# Patient Record
Sex: Male | Born: 2000 | Race: White | Hispanic: No | Marital: Single | Smoking: Never smoker
Health system: Southern US, Community
[De-identification: ages and names within clinical notes are randomized; demographics above are authoritative.]

## PROBLEM LIST (undated history)

## (undated) DIAGNOSIS — L409 Psoriasis, unspecified: Secondary | ICD-10-CM

## (undated) DIAGNOSIS — K51018 Ulcerative (chronic) pancolitis with other complication: Secondary | ICD-10-CM

## (undated) DIAGNOSIS — K626 Ulcer of anus and rectum: Secondary | ICD-10-CM

## (undated) DIAGNOSIS — R6252 Short stature (child): Secondary | ICD-10-CM

## (undated) DIAGNOSIS — K5090 Crohn's disease, unspecified, without complications: Secondary | ICD-10-CM

## (undated) DIAGNOSIS — K529 Noninfective gastroenteritis and colitis, unspecified: Secondary | ICD-10-CM

## (undated) DIAGNOSIS — F32A Depression, unspecified: Secondary | ICD-10-CM

## (undated) DIAGNOSIS — K5909 Other constipation: Secondary | ICD-10-CM

## (undated) DIAGNOSIS — E230 Hypopituitarism: Secondary | ICD-10-CM

## (undated) DIAGNOSIS — R159 Full incontinence of feces: Secondary | ICD-10-CM

## (undated) DIAGNOSIS — K602 Anal fissure, unspecified: Secondary | ICD-10-CM

## (undated) DIAGNOSIS — F909 Attention-deficit hyperactivity disorder, unspecified type: Secondary | ICD-10-CM

## (undated) DIAGNOSIS — K649 Unspecified hemorrhoids: Secondary | ICD-10-CM

## (undated) DIAGNOSIS — M419 Scoliosis, unspecified: Secondary | ICD-10-CM

## (undated) DIAGNOSIS — E739 Lactose intolerance, unspecified: Secondary | ICD-10-CM

## (undated) DIAGNOSIS — F419 Anxiety disorder, unspecified: Secondary | ICD-10-CM

## (undated) DIAGNOSIS — K9041 Non-celiac gluten sensitivity: Secondary | ICD-10-CM

## (undated) DIAGNOSIS — D649 Anemia, unspecified: Secondary | ICD-10-CM

## (undated) DIAGNOSIS — F39 Unspecified mood [affective] disorder: Secondary | ICD-10-CM

## (undated) DIAGNOSIS — K603 Anal fistula, unspecified: Secondary | ICD-10-CM

## (undated) HISTORY — DX: Other constipation: K59.09

## (undated) HISTORY — DX: Psoriasis, unspecified: L40.9

## (undated) HISTORY — DX: Anal fistula, unspecified: K60.30

## (undated) HISTORY — PX: ADENOIDECTOMY: SUR15

## (undated) HISTORY — DX: Ulcerative (chronic) pancolitis with other complication: K51.018

## (undated) HISTORY — DX: Crohn's disease, unspecified, without complications: K50.90

## (undated) HISTORY — DX: Full incontinence of feces: R15.9

## (undated) HISTORY — DX: Hypopituitarism: E23.0

## (undated) HISTORY — DX: Anal fissure, unspecified: K60.2

## (undated) HISTORY — DX: Unspecified hemorrhoids: K64.9

## (undated) HISTORY — DX: Short stature (child): R62.52

## (undated) HISTORY — DX: Unspecified mood (affective) disorder: F39

## (undated) HISTORY — DX: Anemia, unspecified: D64.9

## (undated) HISTORY — DX: Depression, unspecified: F32.A

## (undated) HISTORY — PX: OTHER SURGICAL HISTORY: SHX169

## (undated) HISTORY — DX: Ulcer of anus and rectum: K62.6

## (undated) HISTORY — DX: Attention-deficit hyperactivity disorder, unspecified type: F90.9

---
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
## 2019-01-01 ENCOUNTER — Ambulatory Visit (INDEPENDENT_AMBULATORY_CARE_PROVIDER_SITE_OTHER): Payer: Managed Care, Other (non HMO) | Admitting: Licensed Clinical Social Worker

## 2019-01-01 ENCOUNTER — Other Ambulatory Visit: Payer: Self-pay

## 2019-01-01 DIAGNOSIS — F4323 Adjustment disorder with mixed anxiety and depressed mood: Secondary | ICD-10-CM | POA: Diagnosis not present

## 2019-01-01 NOTE — BH Specialist Note (Signed)
Integrated Behavioral Health Follow Up Visit via Telemedicine Video Visit  07/10/2018 MRN: [id number] Name: Bradley [name]  Number of [place] Clinician visits:: 16 (CCA completed) Session Start time: 1:58 PM Session End time:  3:04 PM Total time: 1 hour 6 minutes  Type of Service: [place] Interpretor:No. Interpretor Name and Language: N/A  Referring Provider: Dr. Yehuda Savannah Type of Visit: Video Patient/Family location: grandparent's house [facility] Provider location: office All persons participating in visit: Lynda, [place]. Hermann Dottavio LCSW  Discussed confidentiality: Yes   I connected with [name] by a video enabled telemedicine application and verified that I am speaking with the correct person(s).  I discussed the limitations of evaluation and management by telemedicine and the availability of in person appointments.  I discussed that the purpose of this visit is to provide behavioral health care while limiting exposure to the novel coronavirus.  Discussed there is a possibility of technology failure and discussed alternative modes of communication if that failure occurs. I discussed that engaging in this video visit, they consent to the provision of behavioral healthcare and the services will be billed under their insurance.  Patient expressed understanding and consented to video visit: Yes    PRESENTING CONCERNS: Patient and/or family reports the following symptoms/concerns: Had a small panic attack last week when at dad's house for lunch. He was able to have some good interactions with paternal grandparents and leave on a positive note. He is focusing on new jobs & interviews, reconnecting with friends, meeting new people and playing a new Public house manager. Duration of problem: 4+ months; Severity of problem: moderate  LIFE CONTEXT:  Below is still current Family and Social: lives with mom, stepdad ("bonus" dad), brother. Was living  with dad and paternal grandparents for 4 months. Close with maternal grandparents and maternal aunt & her family.  School/Work: Taking a gap year 2020-2021. 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 Reduce symptoms of: anxiety, depression and stress 2. Increase knowledge and/or ability of: coping skills and stress reduction  3. Demonstrate ability to: Increase healthy adjustment to current life circumstances  INTERVENTIONS: Interventions utilized: Brief CBT and Supportive Counseling  Standardized Assessments completed: Not Needed  ASSESSMENT: Patient currently experiencing improvement in stress overall. He had an increase in anxiety last week when at dad's and feeling disturbed by some of the disorganization in the home and then felt pressure to clean up his Lego quickly so it is unorganized. [facility] used CBT to help Markevius challenge some of his thinking about being "ashamed" by his things being unorganized as well as what dad should do to show he cares. Overall, Rosbel feels he ended on a good note there and is willing to reach out every 1-2 weeks via phone.  Tabitha is exploring other interests as well, such as reconnecting with friends, prepping for speech & debate, and playing a new game online. He has connected with a new friend through that and had questions about attachments and how to avoid obsessing. Discussed different types of attachment, setting expectations and boundaries, and using safety rules for people met online.   Patient may benefit from ongoing therapy to help manage concerns about life changes due to Covid restrictions and family dynamics.  PLAN: 1. Follow up with behavioral health clinician on : 10/13 2. Behavioral recommendations: If you go back to  dad's, make a plan of what you can say to yourself if things look  unorganized. Have coping skills ready. Otherwise, keep working on identifying what contact feels healthy for you right now. Continue exploring your other interests. With new friendships, remind yourself of the limitations (w/ person who lives in [place]), set boundaries on when to contact, and keep exploring your other interests and friendships. 3. Referral(s): [place] (In Clinic)  I discussed the assessment and treatment plan with the patient. They were provided an opportunity to ask questions and all were answered. They agreed with the plan and demonstrated an understanding of the instructions.   They were advised to call back or seek an in-person evaluation if the symptoms worsen or if the condition fails to improve as anticipated.  Cristi Gwynn E, LCSW

## 2019-01-04 ENCOUNTER — Encounter (INDEPENDENT_AMBULATORY_CARE_PROVIDER_SITE_OTHER): Payer: Self-pay

## 2019-01-08 ENCOUNTER — Other Ambulatory Visit: Payer: Self-pay

## 2019-01-08 ENCOUNTER — Ambulatory Visit (INDEPENDENT_AMBULATORY_CARE_PROVIDER_SITE_OTHER): Payer: Managed Care, Other (non HMO) | Admitting: Licensed Clinical Social Worker

## 2019-01-08 DIAGNOSIS — F4323 Adjustment disorder with mixed anxiety and depressed mood: Secondary | ICD-10-CM | POA: Diagnosis not present

## 2019-01-08 NOTE — BH Specialist Note (Signed)
Integrated Behavioral Health Follow Up Visit via Telemedicine Video Visit  07/10/2018 MRN: [id number] Name: Bradley [name]  Number of [place] Clinician visits:: 17 (CCA completed) Session Start time: 2:59 PM Session End time:  3:29 PM Total time: 30 minutes  Type of Service: [place] Interpretor:No. Interpretor Name and Language: N/A  Referring Provider: Dr. Yehuda Savannah Type of Visit: Video Patient/Family location: home [facility] Provider location: office All persons participating in visit: Viraaj, Vorndran. Faelynn Wynder LCSW  Discussed confidentiality: Yes   I connected with [name] by a video enabled telemedicine application and verified that I am speaking with the correct person(s).  I discussed the limitations of evaluation and management by telemedicine and the availability of in person appointments.  I discussed that the purpose of this visit is to provide behavioral health care while limiting exposure to the novel coronavirus.  Discussed there is a possibility of technology failure and discussed alternative modes of communication if that failure occurs. I discussed that engaging in this video visit, they consent to the provision of behavioral healthcare and the services will be billed under their insurance.  Patient expressed understanding and consented to video visit: Yes    PRESENTING CONCERNS: Patient and/or family reports the following symptoms/concerns: stress regarding navigating new online connection.  Duration of problem: 4+ months; Severity of problem: moderate  LIFE CONTEXT:  Below is still current Family and Social: lives with mom, stepdad ("bonus" dad), brother. Was living with dad and paternal grandparents for 4 months. Close with maternal grandparents and maternal aunt & her family.  School/Work: Taking a gap year 2020-2021. 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 Reduce symptoms of: anxiety, depression and stress 2. Increase knowledge and/or ability of: coping skills and stress reduction  3. Demonstrate ability to: Increase healthy adjustment to current life circumstances  INTERVENTIONS: Interventions utilized: Brief CBT and Supportive Counseling  Standardized Assessments completed: Not Needed  ASSESSMENT: Patient currently experiencing some stress with navigating the online connection with the girl from [place]. He was in a panic earlier in the week about it, but has been able to successfully process through what happened. Jabreel does feel he "can't trust himself" because he developed a small crush (which he no longer feels) even though he knows nothing would come of it because of the distance. However, he has started to notice more of what he wants from future relationships and learning more about what he values.   Patient may benefit from ongoing therapy to help manage concerns about life changes and family dynamics.  PLAN: 1. Follow up with behavioral health clinician on : 10/13 2. Behavioral recommendations: continuing noticing your reactions to information and how people act towards you. This will help you recognize your values and what you do or do not want in future platonic and romantic relationships  3. Referral(s): [place] (In Clinic)  I discussed the assessment and treatment plan with the patient. They were provided an opportunity to ask questions and all were answered. They agreed with the plan and demonstrated an understanding of the instructions.   They were advised to call back or seek an in-person evaluation if the symptoms worsen or if the condition fails to improve as anticipated.  Tonji Elliff E, LCSW

## 2019-01-08 NOTE — Progress Notes (Signed)
Pediatric Gastroenterology Return Visit   REFERRING PROVIDER:  [name], MD [place],  [place] [id number]   ASSESSMENT:     I had the pleasure of seeing Bradley [name], 18 y.o. male (DOB: 2000-12-27) who I saw in follow up today for evaluation of solitary rectal ulcer syndrome, skin tags, constipation, recurrent rectal prolapse,  s/p C difficile infection, in the context of growth hormone deficiency, off growth hormone therapy. Overall, he is doing better. He is asking for a more permanent solution to his issues with defecation mechanics. I offered to request anorectal manometry (ARM), so that we gain insight into his muscular activity during defecation and can advise him accordingly. I explained ARM to him, and he agreed to move forward. I will ask him to under pelvic floor physical therapy guided by the ARM result.  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 This worked for him. He also has needed a round of 40 mg of prednisone since his last visit for 5 days.  He is on perianal Protopic, which he uses occasionally. He has developed intermittent pruritus in his anal skin, but has no lesions on exam. I advised to use a skin moisturizer (e.g. Aveeno) to his anal skin.  I encouraged him to continue communicating via MyChart.      PLAN:       Protopic BID as needed Skin moisturizer for anal skin  Anorectal manometry Albuterol to treat proctalgia fugax Made myself available via MyChart Touch base in 4 weeks   Thank you for allowing [place] to participate in the care of your patient     HISTORY OF PRESENT ILLNESS: Bradley [name] is a 18 y.o. male (DOB: Aug 10, 2000) who is seen in follow up for evaluation of solitary rectal ulcer syndrome with perianal inflammation. History was obtained from Bradley [name].  Bradley [name] found benefit from topical tacrolimus, with reduction of his perianal tags. He uses it occasionally now. He manages proctalgia with albuterol. He  has managed to wean his prednisone, but he took a 5-day course since his last visit. He has intermittent perianal pruritus.  He is staying at his mom's house. He is working at [employer] part-time and doing school work.   PAST MEDICAL HISTORY: Past Medical History:  Diagnosis Date  . ADHD (attention deficit hyperactivity disorder)   . Anxiety   . Chronic constipation   . 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 GSE (gluten-sensitive enteropathy)   . Inflammatory bowel disease   . Lactose intolerance   . Mood disorder ([place])   . Scoliosis   . Short stature    Immunization History  Administered Date(s) Administered  . HPV 9-valent 01/08/2019  . Influenza, Quadrivalent, Recombinant, Inj, Pf 01/08/2019   PAST SURGICAL HISTORY: Past Surgical History:  Procedure Laterality Date  . ADENOIDECTOMY    . COLONOSCOPY N/A 12/27/2015   Procedure: COLONOSCOPY;  Surgeon: Joycelyn Rua, MD;  Location: [place];  Service: Gastroenterology;  Laterality: N/A;  . ESOPHAGOGASTRODUODENOSCOPY N/A 12/27/2015   Procedure: ESOPHAGOGASTRODUODENOSCOPY (EGD);  Surgeon: Joycelyn Rua, MD;  Location: [place];  Service: Gastroenterology;  Laterality: N/A;  . ESOPHAGOGASTRODUODENOSCOPY N/A 08/03/2016   Procedure: ESOPHAGOGASTRODUODENOSCOPY (EGD);  Surgeon: Joycelyn Rua, MD;  Location: [place];  Service: Gastroenterology;  Laterality: N/A;  . FLEXIBLE SIGMOIDOSCOPY  08/03/2016   Procedure: FLEXIBLE SIGMOIDOSCOPY;  Surgeon: Joycelyn Rua, MD;  Location: [facility] ENDOSCOPY;  Service: Gastroenterology;;  . tubes and adenoids     SOCIAL HISTORY: Social History   Socioeconomic  History  . Marital status: Single    Spouse name: Not on file  . Number of children: Not on file  . Years of education: Not on file  . Highest education level: Not on file  Occupational History  . Not on file  Social Needs  . Financial resource strain: Not on file  . Food insecurity    Worry: Not on file    Inability: Not on file  .  Transportation needs    Medical: Not on file    Non-medical: Not on file  Tobacco Use  . Smoking status: Never Smoker  . Smokeless tobacco: Never Used  Substance and Sexual Activity  . Alcohol use: No  . Drug use: No  . Sexual activity: Not Currently    Birth control/protection: None  Lifestyle  . Physical activity    Days per week: Not on file    Minutes per session: Not on file  . Stress: Not on file  Relationships  . Social Herbalist on phone: Not on file    Gets together: Not on file    Attends religious service: Not on file    Active member of club or organization: Not on file    Attends meetings of clubs or organizations: Not on file    Relationship status: Not on file  Other Topics Concern  . Not on file  Social History Narrative   Lives at home with mom dad and brother. 12 th grade    FAMILY HISTORY: family history includes Cancer in his maternal grandfather, paternal grandfather, and paternal grandmother; Depression in his maternal aunt, maternal grandmother, and mother; Diabetes in his paternal grandfather; Hyperlipidemia in his maternal grandfather, maternal grandmother, paternal grandfather, and paternal grandmother; Hypertension in his maternal grandfather; Miscarriages / [place] in his mother; Thyroid disease in his paternal grandfather and paternal grandmother.   REVIEW OF SYSTEMS:  The balance of 12 systems reviewed is negative except as noted in the HPI.  MEDICATIONS: Current Outpatient Medications  Medication Sig Dispense Refill  . escitalopram (LEXAPRO) 20 MG tablet Take 20 mg by mouth daily.    Marland Kitchen albuterol (VENTOLIN HFA) 108 (90 Base) MCG/ACT inhaler Inhale 2 puffs into the lungs every 6 (six) hours as needed for wheezing or shortness of breath. 1 Inhaler 2  . Iron-Vitamin C (IRON 100/C) 100-250 MG TABS Take by mouth.    . lamoTRIgine (LAMICTAL) 150 MG tablet Take 1 tablet (150 mg total) by mouth 2 (two) times daily. 60 tablet 1  .  methylphenidate 27 MG PO TB24     . Tacrolimus (PROTOPIC EX) Apply topically 2 (two) times a day.    . traZODone (DESYREL) 50 MG tablet      No current facility-administered medications for this visit.    ALLERGIES: Trileptal [oxcarbazepine]  VITAL SIGNS: BP 100/64   Pulse (!) 114   Wt 106 lb 9.6 oz (48.4 kg)   BMI 18.88 kg/m  PHYSICAL EXAM: Constitutional: Alert, no acute distress, well nourished, and well hydrated.  Mental Status: Pleasantly interactive, not anxious appearing. HEENT: PERRL, conjunctiva clear, anicteric, oropharynx clear, neck supple, no LAD. Respiratory: Clear to auscultation, unlabored breathing. Cardiac: Euvolemic, regular rate and rhythm, normal S1 and S2, no murmur. Abdomen: Soft, normal bowel sounds, non-distended, non-tender, no organomegaly or masses. Perianal/Rectal Exam: Normal position of the anus, no spine dimples, no hair tufts. No skin tags or hemorrhoids or fissures. Extremities: No edema, well perfused. Musculoskeletal: No joint swelling or tenderness noted, no  deformities. Skin: No rashes, jaundice or skin lesions noted. Neuro: No focal deficits.    No results found for this or any previous visit (from the past 2160 hour(s)).   Merica Prell A. Yehuda Savannah, MD Chief, Division of Pediatric Gastroenterology Professor of Pediatrics

## 2019-01-12 ENCOUNTER — Other Ambulatory Visit: Payer: Self-pay

## 2019-01-12 ENCOUNTER — Encounter (INDEPENDENT_AMBULATORY_CARE_PROVIDER_SITE_OTHER): Payer: Self-pay | Admitting: Pediatric Gastroenterology

## 2019-01-12 ENCOUNTER — Ambulatory Visit (INDEPENDENT_AMBULATORY_CARE_PROVIDER_SITE_OTHER): Payer: Managed Care, Other (non HMO) | Admitting: Pediatric Gastroenterology

## 2019-01-12 VITALS — BP 100/64 | HR 114 | Wt 106.6 lb

## 2019-01-12 DIAGNOSIS — K626 Ulcer of anus and rectum: Secondary | ICD-10-CM | POA: Diagnosis not present

## 2019-01-12 NOTE — Patient Instructions (Signed)
Contact information For emergencies after hours, on holidays or weekends: call [phone] and ask for the pediatric gastroenterologist on call.  For regular business hours: Pediatric GI phone number: Eustace Moore [phone] OR Use MyChart to send messages  A special favor Our waiting list is over 2 months. Other children are waiting to be seen in our clinic. If you cannot make your next appointment, please contact us with at least 2 days notice to cancel and reschedule. Your timely phone call will allow another child to use the clinic slot.  Thank you!

## 2019-01-13 ENCOUNTER — Other Ambulatory Visit: Payer: Self-pay

## 2019-01-13 ENCOUNTER — Encounter (INDEPENDENT_AMBULATORY_CARE_PROVIDER_SITE_OTHER): Payer: Self-pay | Admitting: *Deleted

## 2019-01-13 ENCOUNTER — Ambulatory Visit (INDEPENDENT_AMBULATORY_CARE_PROVIDER_SITE_OTHER): Payer: Managed Care, Other (non HMO) | Admitting: Licensed Clinical Social Worker

## 2019-01-13 DIAGNOSIS — F4323 Adjustment disorder with mixed anxiety and depressed mood: Secondary | ICD-10-CM | POA: Diagnosis not present

## 2019-01-13 NOTE — BH Specialist Note (Signed)
Integrated Behavioral Health Follow Up Visit via Telemedicine Video Visit  07/10/2018 MRN: [id number] Name: Bradley [name]  Number of [place] Clinician visits:: 18 (CCA completed) Session Start time: 8:27 AM    Session End time:  9:20 AM Total time: 53 minutes  Type of Service: [place] Interpretor:No. Interpretor Name and Language: N/A  Referring Provider: Dr. Yehuda Savannah Type of Visit: Video Patient/Family location: home [facility] Provider location: office All persons participating in visit: Willys, Salvino. Stoisits LCSW  Discussed confidentiality: Yes   I connected with [name] by a video enabled telemedicine application and verified that I am speaking with the correct person(s).  I discussed the limitations of evaluation and management by telemedicine and the availability of in person appointments.  I discussed that the purpose of this visit is to provide behavioral health care while limiting exposure to the novel coronavirus.  Discussed there is a possibility of technology failure and discussed alternative modes of communication if that failure occurs. I discussed that engaging in this video visit, they consent to the provision of behavioral healthcare and the services will be billed under their insurance.  Patient expressed understanding and consented to video visit: Yes    PRESENTING CONCERNS: Patient and/or family reports the following symptoms/concerns: continuing working on setting boundaries and managing stressors with dad, paternal grandparents, and new people he is meeting online. 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   Duration of problem: 4+ months; Severity of problem: moderate  LIFE CONTEXT:  Below is still current Family and Social: lives with mom, stepdad ("bonus" dad), brother. Was living with dad and paternal grandparents  for 4 months. Close with maternal grandparents and maternal aunt & her family.  School/Work: Taking a gap year 2020-2021. 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 Applied to [employer] for next year Self-Care: likes video games, Nature conservation officer, talking to people, cooking  GOALS ADDRESSED: Below is still current Patient will: 1. Reduce symptoms of: anxiety, depression and stress 2. Increase knowledge and/or ability of: coping skills and stress reduction  3. Demonstrate ability to: Increase healthy adjustment to current life circumstances  INTERVENTIONS: Interventions utilized: Brief CBT and Supportive Counseling  Standardized Assessments completed: Not Needed  ASSESSMENT: Patient currently doing well with mood since last week. He was able to set a healthy limit with person from [place] online and is setting limits while still learning about another person he met online. He was bothered by how he developed a crush on the first girl without intending to because he prides himself on being logical. Discussed how emotions aren't logical but they are normal and explored how he still used logic and reasoning to notice what he did or did not appreciate about the interactions and how to use what he learned when deciding who to pursue relationships with in the future.  He went to his dad's house and helped set up and host a zoom birthday party for his PGM and was able to do so successfully without having an anxiety attack during this visit by utilizing his other support people prior to going over.   Patient may benefit from ongoing therapy to help manage concerns about life changes and family dynamics.  PLAN: 1. Follow up with behavioral health clinician on : 1 week 2. Behavioral recommendations: continuing noticing your reactions to information and how people  act towards you. This will help you recognize your values and what you do or do not want in future platonic and  romantic relationships. Good job working on setting boundaries  3. Referral(s): [place] (In Clinic)  I discussed the assessment and treatment plan with the patient. They were provided an opportunity to ask questions and all were answered. They agreed with the plan and demonstrated an understanding of the instructions.   They were advised to call back or seek an in-person evaluation if the symptoms worsen or if the condition fails to improve as anticipated.  STOISITS, MICHELLE E, LCSW

## 2019-01-20 ENCOUNTER — Telehealth (INDEPENDENT_AMBULATORY_CARE_PROVIDER_SITE_OTHER): Payer: Self-pay | Admitting: Pediatric Gastroenterology

## 2019-01-20 ENCOUNTER — Ambulatory Visit (INDEPENDENT_AMBULATORY_CARE_PROVIDER_SITE_OTHER): Payer: Managed Care, Other (non HMO) | Admitting: Licensed Clinical Social Worker

## 2019-01-20 ENCOUNTER — Other Ambulatory Visit: Payer: Self-pay

## 2019-01-20 DIAGNOSIS — F4323 Adjustment disorder with mixed anxiety and depressed mood: Secondary | ICD-10-CM

## 2019-01-20 NOTE — Telephone Encounter (Signed)
  Who's calling (name and relationship to patient) : Bradley [name], patient  Best contact number: [phone]  Provider they see: Dr. Yehuda Savannah   Reason for call: Patient states per a conversation with Dr. Yehuda Savannah there is procedure that was supposed to be scheduled called an ano rectal manometry. Patient hasn't heard back yet, spoke with Eustace Moore who states she will get in touch with Ria Comment from [facility] to schedule that procedure at [facility]. Patient also has questions relating to finances, he plans to call his insurance first to see if they cover this and how much, but will call us back with further questions. Please call with an update relating to scheduling of the procedure when there is one.     PRESCRIPTION REFILL ONLY  Name of prescription:  Pharmacy:

## 2019-01-20 NOTE — Telephone Encounter (Signed)
For the ARM procedures, those are faxed to adult GI in [place]. I have faxed over the referral and demographics with the insurance card to them. They should be  Calling the patient to set up an appointment to get that taken care of.

## 2019-01-20 NOTE — BH Specialist Note (Signed)
Integrated Behavioral Health Follow Up Visit via Telemedicine Video Visit  07/10/2018 MRN: [id number] Name: Bradley [name]  Number of [place] Clinician visits:: 88 (CCA completed) Session Start time: 8:24 AM    Session End time:  9:24 AM Total time: 1 hour  Type of Service: [place] Interpretor:No. Interpretor Name and Language: N/A  Referring Provider: Dr. Yehuda Savannah Type of Visit: Video Patient/Family location: home [facility] Provider location: office All persons participating in visit: [place], Prieur. Lupie Sawa LCSW  Discussed confidentiality: Yes   I connected with [name] by a video enabled telemedicine application and verified that I am speaking with the correct person(s).  I discussed the limitations of evaluation and management by telemedicine and the availability of in person appointments.  I discussed that the purpose of this visit is to provide behavioral health care while limiting exposure to the novel coronavirus.  Discussed there is a possibility of technology failure and discussed alternative modes of communication if that failure occurs. I discussed that engaging in this video visit, they consent to the provision of behavioral healthcare and the services will be billed under their insurance.  Patient expressed understanding and consented to video visit: Yes    PRESENTING CONCERNS: Patient and/or family reports the following symptoms/concerns: improvements in mood. He is walking with MGM and her friends more often now (5 miles), received a job offer from [employer], and was successful in hosting a zoom birthday for [facility]. He is able to better handle comments from dad that previously were hurtful or anxiety-producing for him.   Duration of problem: 4+ months; Severity of problem: moderate  LIFE CONTEXT:  Below is still current Family and Social: lives with mom, stepdad ("bonus" dad), brother. Was living with dad and  paternal grandparents for 4 months. Close with maternal grandparents and maternal aunt & her family.  School/Work: Taking a gap year 2020-2021. Working at [employer], working at [employer], interviewing for other jobs. Applied to [employer] for next year Self-Care: likes video games, Nature conservation officer, talking to people, cooking  GOALS ADDRESSED: Below is still current Patient will: 1. Reduce symptoms of: anxiety, depression and stress 2. Increase knowledge and/or ability of: coping skills and stress reduction  3. Demonstrate ability to: Increase healthy adjustment to current life circumstances  INTERVENTIONS: Interventions utilized: Brief CBT and Supportive Counseling  Standardized Assessments completed: Not Needed  ASSESSMENT: Patient currently doing well overall. He has been able to spend more time talking with support people, including on walks with his MGM. He is continuing to work on seeing situations in more than black and white and changing how he reacts to dad by changing his thinking (working on noticing how to set reasonable expectations and not meeting standards that are unrealistic). He is moving forward with procedure and PT to help him physically as well.  Patient may benefit from continuing use of skills and supports to help manage concerns about life changes and family dynamics.  PLAN: 1. Follow up with behavioral health clinician on : PRN 2. Behavioral recommendations: keep trying to notice other perspectives (more than black and white). Continue spending time with your support people (brother, MGM, mom) 3. Referral(s): [place] (In Clinic)  I discussed the assessment and treatment plan with the patient. They were provided an opportunity to ask questions and all were answered. They agreed with the plan and demonstrated an understanding of the instructions.   They were advised to call back or  seek an in-person evaluation if the  symptoms worsen or if the condition fails to improve as anticipated.  Merian Wroe E, LCSW

## 2019-01-22 ENCOUNTER — Encounter (INDEPENDENT_AMBULATORY_CARE_PROVIDER_SITE_OTHER): Payer: Self-pay

## 2019-01-22 ENCOUNTER — Telehealth (INDEPENDENT_AMBULATORY_CARE_PROVIDER_SITE_OTHER): Payer: Self-pay | Admitting: Pediatric Gastroenterology

## 2019-01-22 ENCOUNTER — Other Ambulatory Visit (INDEPENDENT_AMBULATORY_CARE_PROVIDER_SITE_OTHER): Payer: Self-pay | Admitting: Pediatric Gastroenterology

## 2019-01-22 NOTE — Telephone Encounter (Signed)
I forwarded his mychart message from earlier to Dr Yehuda Savannah to advise.

## 2019-01-22 NOTE — Telephone Encounter (Signed)
°  Who's calling (name and relationship to patient) : Jayston (Self)  Best contact number: [phone] Provider they see: Dr. Yehuda Savannah  Reason for call: Pt requesting rx for Prednizone.      PRESCRIPTION REFILL ONLY  Name of prescription: Prednizone  Pharmacy: [facility] [address] [facility])

## 2019-01-23 ENCOUNTER — Other Ambulatory Visit: Payer: Self-pay

## 2019-01-23 DIAGNOSIS — K50911 Crohn's disease, unspecified, with rectal bleeding: Secondary | ICD-10-CM

## 2019-01-23 MED ORDER — PREDNISONE 20 MG PO TABS: 20.0000 mg | ORAL_TABLET | Freq: Two times a day (BID) | ORAL | 0 refills | Status: DC

## 2019-01-23 NOTE — Telephone Encounter (Signed)
Per Dr Yehuda Savannah. 54m for 5 days. Rx [facility]

## 2019-01-24 ENCOUNTER — Encounter (INDEPENDENT_AMBULATORY_CARE_PROVIDER_SITE_OTHER): Payer: Self-pay

## 2019-02-03 ENCOUNTER — Ambulatory Visit (INDEPENDENT_AMBULATORY_CARE_PROVIDER_SITE_OTHER): Payer: Managed Care, Other (non HMO) | Admitting: Licensed Clinical Social Worker

## 2019-02-03 ENCOUNTER — Other Ambulatory Visit: Payer: Self-pay

## 2019-02-03 DIAGNOSIS — F4323 Adjustment disorder with mixed anxiety and depressed mood: Secondary | ICD-10-CM | POA: Diagnosis not present

## 2019-02-03 NOTE — BH Specialist Note (Signed)
Integrated Behavioral Health Follow Up Visit via Telemedicine Video Visit  07/10/2018 MRN: [id number] Name: Bradley [name]  Number of [place] Clinician visits:: 20 (CCA completed) Session Start time: 10:26 AM    Session End time:  11:30 AM Total time: 64 minutes  Type of Service: [place] Interpretor:No. Interpretor Name and Language: N/A  Referring Provider: Dr. Yehuda Savannah Type of Visit: Video Patient/Family location: home [facility] Provider location: office All persons participating in visit: Bradley [name], Pry. Stoisits LCSW  Discussed confidentiality: Yes   I connected with [name] by a video enabled telemedicine application and verified that I am speaking with the correct person(s).  I discussed the limitations of evaluation and management by telemedicine and the availability of in person appointments.  I discussed that the purpose of this visit is to provide behavioral health care while limiting exposure to the novel coronavirus.  Discussed there is a possibility of technology failure and discussed alternative modes of communication if that failure occurs. I discussed that engaging in this video visit, they consent to the provision of behavioral healthcare and the services will be billed under their insurance.  Patient expressed understanding and consented to video visit: Yes    PRESENTING CONCERNS: Patient and/or family reports the following symptoms/concerns: stressors with job change. Working on figuring out goals for being in a relationship. Upcoming interactions with dad for his birthday and grandfather's birthday.    Duration of problem: 4+ months; Severity of problem: mild  LIFE CONTEXT:  Below is still current Family and Social: lives with mom, stepdad ("bonus" dad), brother. Was living with dad and paternal grandparents for 4 months. Close with maternal grandparents and maternal aunt & her family.  School/Work: Taking a  gap year 2020-2021. 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 Applied to [employer] for next year Self-Care: likes video games, Nature conservation officer, talking to people, cooking  GOALS ADDRESSED: Below is still current Patient will: 1. Reduce symptoms of: anxiety, depression and stress 2. Increase knowledge and/or ability of: coping skills and stress reduction  3. Demonstrate ability to: Increase healthy adjustment to current life circumstances  INTERVENTIONS: Interventions utilized: Brief CBT and Supportive Counseling  Standardized Assessments completed: Not Needed  ASSESSMENT: Patient currently experiencing some changes with starting and then no longer working at [employer]. [name] is working on some goals of reaching out to more friends, applying to more jobs, and pursuing a possible romantic relationship. Continuing to work through what he has observed in his parents that he does or does not want to embody in his own relationships. He is worried that he will need up hurting someone unintentionally with something he says that does not come across how it was intended.  Patient may benefit from continuing use of skills and supports to help manage concerns about life changes and family dynamics.  PLAN: 1. Follow up with behavioral health clinician on : 1 week 2. Behavioral recommendations: Continue exploring and noticing what qualities you do and do not want to embody in your relationships. Communication is a give and take. As you see how others react, you will learn about their style and if you can or want to grow your own to fit theirs. ] 3. Referral(s): [place] (In Clinic)  I discussed the assessment and treatment plan with the patient. They were provided an opportunity to ask questions and all were answered. They agreed with the plan and demonstrated an understanding of the instructions.  They were advised to call back or seek an in-person  evaluation if the symptoms worsen or if the condition fails to improve as anticipated.  [name], MICHELLE E, LCSW

## 2019-02-06 ENCOUNTER — Encounter (INDEPENDENT_AMBULATORY_CARE_PROVIDER_SITE_OTHER): Payer: Self-pay

## 2019-02-07 ENCOUNTER — Other Ambulatory Visit: Payer: Self-pay | Admitting: Pediatric Gastroenterology

## 2019-02-07 DIAGNOSIS — K50911 Crohn's disease, unspecified, with rectal bleeding: Secondary | ICD-10-CM

## 2019-02-09 ENCOUNTER — Encounter (INDEPENDENT_AMBULATORY_CARE_PROVIDER_SITE_OTHER): Payer: Self-pay

## 2019-02-10 ENCOUNTER — Other Ambulatory Visit: Payer: Self-pay

## 2019-02-10 ENCOUNTER — Ambulatory Visit (INDEPENDENT_AMBULATORY_CARE_PROVIDER_SITE_OTHER): Payer: Managed Care, Other (non HMO) | Admitting: Licensed Clinical Social Worker

## 2019-02-10 DIAGNOSIS — F4323 Adjustment disorder with mixed anxiety and depressed mood: Secondary | ICD-10-CM | POA: Diagnosis not present

## 2019-02-10 NOTE — BH Specialist Note (Signed)
Integrated Behavioral Health Follow Up Visit via Telemedicine Video Visit  07/10/2018 MRN: [id number] Name: TYRA Tobe Kervin  Number of [place] Clinician visits:: 21 (CCA completed) Session Start time: 10:34 AM    Session End time:  11:34 AM Total time: 60  minutes  Type of Service: [place] Interpretor:No. Interpretor Name and Language: N/A  Referring Provider: Dr. Yehuda Savannah Type of Visit: Video Patient/Family location: home [facility] Provider location: office All persons participating in visit: Akon, Reinoso. Newton Frutiger LCSW  Discussed confidentiality: Yes   I connected with [name] by a video enabled telemedicine application and verified that I am speaking with the correct person(s).  I discussed the limitations of evaluation and management by telemedicine and the availability of in person appointments.  I discussed that the purpose of this visit is to provide behavioral health care while limiting exposure to the novel coronavirus.  Discussed there is a possibility of technology failure and discussed alternative modes of communication if that failure occurs. I discussed that engaging in this video visit, they consent to the provision of behavioral healthcare and the services will be billed under their insurance.  Patient expressed understanding and consented to video visit: Yes    PRESENTING CONCERNS: Patient and/or family reports the following symptoms/concerns: Many stressors with the election, feeling resentful to the [place] 's owner, change in schedule with speech & debate, thinking about changing doctors due to schedule and age, and dad's and PGF's upcoming birthdays. This is causing flare up in physical symptoms, causing significant pain the last week (slowly improving now) Duration of problem: 4+ months; Severity of problem: mild  LIFE CONTEXT: Below is still current Family and Social: lives with mom, stepdad ("bonus" dad),  brother. Was living with dad and paternal grandparents for 4 months. Close with maternal grandparents and maternal aunt & her family.  School/Work: Taking a gap year 2020-2021. 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 Applied to [employer] for next year Self-Care: likes video games, Nature conservation officer, talking to people, cooking  GOALS ADDRESSED: Below is still current Patient will: 1. Reduce symptoms of: anxiety, depression and stress 2. Increase knowledge and/or ability of: coping skills and stress reduction  3. Demonstrate ability to: Increase healthy adjustment to current life circumstances  INTERVENTIONS: Interventions utilized: Brief CBT and Supportive Counseling  Standardized Assessments completed: Not Needed  ASSESSMENT: Patient currently experiencing many stressors as noted above. [facility] provided supportive listening around many of the concerns. Majority of visit focused on request from dad to create slides for projector to show on PGF's birthday. Discussed different options for action as well as how to try to change mindset towards it to alter reactions. Also worked on recognizing specific examples of how Octavio acts differently than his dad even though he tends to only filter in times where he acts similarly.   Patient may benefit from continuing use of skills and supports to help manage concerns about life changes and family dynamics.  PLAN: 1. Follow up with behavioral health clinician on : 1 week 2. Behavioral recommendations: Reframe doing the slides to help manage your emotional reaction and prevent further physical issues. Notice your filter for behavior and try to acknowledge when you are communicating well and how you react when you have a misstep and how that is different than others that you do not want to be like. 3. Referral(s): [place] (In Clinic)  I discussed the assessment and treatment plan with  the patient. They  were provided an opportunity to ask questions and all were answered. They agreed with the plan and demonstrated an understanding of the instructions.   They were advised to call back or seek an in-person evaluation if the symptoms worsen or if the condition fails to improve as anticipated.  Amiri Tritch E, LCSW

## 2019-02-17 ENCOUNTER — Ambulatory Visit (INDEPENDENT_AMBULATORY_CARE_PROVIDER_SITE_OTHER): Payer: Managed Care, Other (non HMO) | Admitting: Licensed Clinical Social Worker

## 2019-02-22 ENCOUNTER — Encounter (INDEPENDENT_AMBULATORY_CARE_PROVIDER_SITE_OTHER): Payer: Self-pay

## 2019-02-24 NOTE — BH Specialist Note (Signed)
Integrated Behavioral Health Follow Up Visit via Telemedicine Video Visit  07/10/2018 MRN: [id number] Name: CHEROKEE CLOWERS  Number of [place] Clinician visits:: 22 (CCA completed) Session Start time: 8:25 AM   Session End time:  9:35 AM Total time: 70  Minutes  Type of Service: [place] Interpretor:No. Interpretor Name and Language: N/A  Referring Provider: Dr. Yehuda Savannah Type of Visit: Video Patient/Family location: home [facility] Provider location: office All persons participating in visit: Lott, Seelbach. Forrestine Lecrone LCSW  Discussed confidentiality: Yes   I connected with [name] by a video enabled telemedicine application and verified that I am speaking with the correct person(s).  I discussed the limitations of evaluation and management by telemedicine and the availability of in person appointments.  I discussed that the purpose of this visit is to provide behavioral health care while limiting exposure to the novel coronavirus.  Discussed there is a possibility of technology failure and discussed alternative modes of communication if that failure occurs. I discussed that engaging in this video visit, they consent to the provision of behavioral healthcare and the services will be billed under their insurance.  Patient expressed understanding and consented to video visit: Yes    PRESENTING CONCERNS: Patient and/or family reports the following symptoms/concerns: Various stressors since last visit with friends (boundaries, disagreeing about political ideology) and brother (arguing every opinion, in personal space).  Thanksgiving went well at dad's house Duration of problem: 4+ months; Severity of problem: mild  LIFE CONTEXT: Below is still current Family and Social: lives with mom, stepdad ("bonus" dad), brother. Was living with dad and paternal grandparents for 4 months. Close with maternal grandparents and maternal aunt & her family.   School/Work: Taking a gap year 2020-2021. 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 Applied to [employer] for next year Self-Care: likes video games, Nature conservation officer, talking to people, cooking  GOALS ADDRESSED: Below is still current Patient will: 1. Reduce symptoms of: anxiety, depression and stress 2. Increase knowledge and/or ability of: coping skills and stress reduction  3. Demonstrate ability to: Increase healthy adjustment to current life circumstances  INTERVENTIONS: Interventions utilized: Brief CBT and Supportive Counseling  Standardized Assessments completed: Not Needed  ASSESSMENT: Patient currently experiencing stressors with friends and brother as noted above. [facility] provided supportive listening and [place]olen was able to work through some of the concerns with minimal prompting. He showed progress with perspective taking and not being as harsh on himself for a misstep in communication.    Patient may benefit from continuing use of skills and supports to help manage concerns about life changes and family dynamics.  PLAN: 1. Follow up with behavioral health clinician on : 1 week 2. Behavioral recommendations: Set boundaries and choose your battles with friend and brother. Focus on what you can control for yourself and understanding that it is hard to change someone's mind immediately in a disagreement on ideology.  3. Referral(s): [place] (In Clinic)  I discussed the assessment and treatment plan with the patient. They were provided an opportunity to ask questions and all were answered. They agreed with the plan and demonstrated an understanding of the instructions.   They were advised to call back or seek an in-person evaluation if the symptoms worsen or if the condition fails to improve as anticipated.  Jenan Ellegood E, LCSW

## 2019-03-02 NOTE — Progress Notes (Signed)
Pediatric Gastroenterology Return Visit   REFERRING PROVIDER:  [name], MD Bradley [name],  Bradley [name] [id number]   ASSESSMENT:     I had the pleasure of seeing Bradley [name], 18 y.o. 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 Overall, he feels that he recovers more quickly after episodes of pain.   The anticipation of doing the ARM caused a great deal of anxiety. We will postpone it. He is getting therapy and strategies for stress management. Prednisone helps to break anxiety, especially anxiety around proctalgia.  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 This worked for him, but he does not use consistently. He also has needed a round of 40 mg of prednisone since his last visit for 5 days. He has a prescription and 2 refills.  He is on perianal Protopic, which he uses occasionally.   I encouraged him to continue communicating via MyChart.      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 History was obtained from [place].  Bradley [name] found benefit from topical tacrolimus, with reduction of his perianal tags but it burns him on first application. He uses it rarely now. He manages proctalgia with albuterol and prednisone. We are postponing the ARM due to associated anxiety and clinical improvement.  He is staying at his mom's house. He is working at [employer] part-time and doing school work.   PAST MEDICAL HISTORY: Past Medical History:  Diagnosis Date  . ADHD (attention deficit hyperactivity disorder)   . Anxiety   . Chronic constipation   . 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 GSE (gluten-sensitive enteropathy)   . Inflammatory bowel disease   . Lactose intolerance   . Mood disorder ([place])   . Scoliosis   . Short stature    Immunization History  Administered Date(s) Administered  . HPV 9-valent 01/08/2019  . Influenza, Quadrivalent, Recombinant, Inj, Pf 01/08/2019   PAST SURGICAL HISTORY: Past Surgical History:  Procedure Laterality Date  . ADENOIDECTOMY    . COLONOSCOPY N/A 12/27/2015   Procedure: COLONOSCOPY;  Surgeon: Joycelyn Rua, MD;  Location: [place];  Service: Gastroenterology;  Laterality: N/A;  . ESOPHAGOGASTRODUODENOSCOPY N/A 12/27/2015   Procedure: ESOPHAGOGASTRODUODENOSCOPY (EGD);  Surgeon: Joycelyn Rua, MD;  Location: [place];  Service: Gastroenterology;  Laterality: N/A;  . ESOPHAGOGASTRODUODENOSCOPY N/A 08/03/2016   Procedure: ESOPHAGOGASTRODUODENOSCOPY (EGD);  Surgeon: Joycelyn Rua, MD;  Location: [place];  Service: Gastroenterology;  Laterality: N/A;  . FLEXIBLE SIGMOIDOSCOPY  08/03/2016   Procedure: FLEXIBLE SIGMOIDOSCOPY;  Surgeon: Joycelyn Rua, MD;  Location: [facility] ENDOSCOPY;  Service: Gastroenterology;;  . tubes and adenoids     SOCIAL HISTORY: Social History   Socioeconomic History  . Marital status: Single    Spouse name: Not on file  . Number of children: Not on file  . Years of education: Not on file  . Highest education level: Not on file  Occupational History  . Not on file  Social Needs  . Financial resource strain: Not on file  . Food insecurity    Worry: Not on file    Inability: Not on file  . Transportation needs    Medical: Not on file    Non-medical: Not on file  Tobacco Use  . Smoking status: Never Smoker  . Smokeless tobacco: Never Used  Substance and Sexual Activity   . Alcohol use: No  . Drug use: No  . Sexual activity: Not Currently    Birth control/protection: None  Lifestyle  . Physical activity    Days per week: Not on file    Minutes per session: Not on file  . Stress: Not on file  Relationships  . Social Herbalist on phone: Not on file    Gets together: Not on file    Attends religious service: Not on file    Active member of club or organization: Not on file    Attends meetings of clubs or organizations: Not on file    Relationship status: Not on file  Other Topics Concern  . Not on file  Social History Narrative   Lives at home with mom dad and brother. 12 th grade    FAMILY HISTORY: family history includes Cancer in his maternal grandfather, paternal grandfather, and paternal grandmother; Depression in his maternal aunt, maternal grandmother, and mother; Diabetes in his paternal grandfather; Hyperlipidemia in his maternal grandfather, maternal grandmother, paternal grandfather, and paternal grandmother; Hypertension in his maternal grandfather; Miscarriages / [place] in his mother; Thyroid disease in his paternal grandfather and paternal grandmother.   REVIEW OF SYSTEMS:  The balance of 12 systems reviewed is negative except as noted in the HPI.  MEDICATIONS: Current Outpatient Medications  Medication Sig Dispense Refill  . albuterol (VENTOLIN HFA) 108 (90 Base) MCG/ACT inhaler Inhale 2 puffs into the lungs every 6 (six) hours as needed for wheezing or shortness of breath. 1 Inhaler 2  . escitalopram (LEXAPRO) 20 MG tablet Take 20 mg by mouth daily.    . Iron-Vitamin C (IRON 100/C) 100-250 MG TABS Take by mouth.    . lamoTRIgine (LAMICTAL) 150 MG tablet Take 1 tablet (150 mg total) by mouth 2 (two) times daily. 60 tablet 1  . methylphenidate 27 MG PO TB24     . predniSONE (DELTASONE) 20 MG tablet TAKE ONE TABLET BY MOUTH TWICE A DAY WITH A MEAL 10 tablet 0  . Tacrolimus (PROTOPIC EX) Apply topically 2 (two) times a  day.    . traZODone (DESYREL) 50 MG tablet      No current facility-administered medications for this visit.    ALLERGIES: Trileptal [oxcarbazepine]  VITAL SIGNS: There were no vitals taken for this visit. PHYSICAL EXAM: Constitutional: Alert, no acute distress, well nourished, and well hydrated.  Mental Status: Pleasantly interactive, not anxious appearing. HEENT: PERRL, conjunctiva clear, anicteric, oropharynx clear, neck supple, no LAD. Respiratory: Clear to auscultation, unlabored breathing. Cardiac: Euvolemic, regular rate and rhythm, normal S1 and S2, no murmur. Abdomen: Soft, normal bowel sounds, non-distended, non-tender, no organomegaly or masses. Perianal/Rectal Exam: Normal position of the anus, no spine dimples, no hair tufts. Small skin tags, no hemorrhoids or fissures. Extremities: No edema, well perfused. Musculoskeletal: No joint swelling or tenderness noted, no deformities. Skin: No rashes, jaundice or skin lesions noted. Neuro: No focal deficits.    No results found for this or any previous visit (from the past 2160 hour(s)).   A. Yehuda Savannah, MD Chief, Division of Pediatric Gastroenterology Professor of Pediatrics

## 2019-03-03 ENCOUNTER — Ambulatory Visit (INDEPENDENT_AMBULATORY_CARE_PROVIDER_SITE_OTHER): Payer: Managed Care, Other (non HMO) | Admitting: Licensed Clinical Social Worker

## 2019-03-03 ENCOUNTER — Other Ambulatory Visit: Payer: Self-pay

## 2019-03-03 DIAGNOSIS — F4323 Adjustment disorder with mixed anxiety and depressed mood: Secondary | ICD-10-CM

## 2019-03-09 ENCOUNTER — Ambulatory Visit (INDEPENDENT_AMBULATORY_CARE_PROVIDER_SITE_OTHER): Payer: Managed Care, Other (non HMO) | Admitting: Pediatric Gastroenterology

## 2019-03-09 ENCOUNTER — Encounter (INDEPENDENT_AMBULATORY_CARE_PROVIDER_SITE_OTHER): Payer: Self-pay | Admitting: Pediatric Gastroenterology

## 2019-03-09 ENCOUNTER — Other Ambulatory Visit: Payer: Self-pay

## 2019-03-09 VITALS — BP 128/76 | HR 96 | Ht 63.19 in | Wt 103.6 lb

## 2019-03-09 DIAGNOSIS — K626 Ulcer of anus and rectum: Secondary | ICD-10-CM

## 2019-03-09 MED ORDER — PREDNISONE 20 MG PO TABS: 40.0000 mg | ORAL_TABLET | Freq: Every day | ORAL | 2 refills | Status: AC

## 2019-03-09 NOTE — Patient Instructions (Signed)
Contact information For emergencies after hours, on holidays or weekends: call [phone] and ask for the pediatric gastroenterologist on call.  For regular business hours: Pediatric GI phone number: Eustace Moore [phone] OR Use MyChart to send messages  A special favor Our waiting list is over 2 months. Other children are waiting to be seen in our clinic. If you cannot make your next appointment, please contact us with at least 2 days notice to cancel and reschedule. Your timely phone call will allow another child to use the clinic slot.  Thank you!

## 2019-03-09 NOTE — BH Specialist Note (Signed)
Integrated Behavioral Health Follow Up Visit via Telemedicine Video Visit  07/10/2018 MRN: [id number] Name: Bradley [name]  Number of [place] Clinician visits:: 23 (CCA completed) Session Start time: 9:28 AM   Session End time:  10:28 AM Total time: 60 minutes  Type of Service: [place] Interpretor:No. Interpretor Name and Language: N/A  Referring Provider: Dr. Yehuda Savannah Type of Visit: Video Patient/Family location: home [facility] Provider location: office All persons participating in visit: Daelin, Haste. Jenesis Martin   Discussed confidentiality: Yes   I connected with [name] by a video enabled telemedicine application and verified that I am speaking with the correct person(s).  I discussed the limitations of evaluation and management by telemedicine and the availability of in person appointments.  I discussed that the purpose of this visit is to provide behavioral health care while limiting exposure to the novel coronavirus.  Discussed there is a possibility of technology failure and discussed alternative modes of communication if that failure occurs. I discussed that engaging in this video visit, they consent to the provision of behavioral healthcare and the services will be billed under their insurance.  Patient expressed understanding and consented to video visit: Yes    PRESENTING CONCERNS: Patient and/or family reports the following symptoms/concerns: Noticing more tension in his body. Situation with playing a game as a family where all of the rules were not followed which caused high stress. Starting to realize more of the experiences he is having at dad's house with feeling lonely.  Duration of problem: 6+ months; Severity of problem: mild  LIFE CONTEXT: Below is still current Family and Social: lives with mom, stepdad ("bonus" dad), brother. Was living with dad and paternal grandparents for 4 months. Close with maternal  grandparents and maternal aunt & her family.  School/Work: Taking a gap year 2020-2021. 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 Applied to [employer] for next year Self-Care: likes video games, Nature conservation officer, talking to people, cooking  GOALS ADDRESSED: Below is still current Patient will: 1. Reduce symptoms of: anxiety, depression and stress 2. Increase knowledge and/or ability of: coping skills and stress reduction  3. Demonstrate ability to: Increase healthy adjustment to current life circumstances  INTERVENTIONS: Interventions utilized: Brief CBT and Supportive Counseling  Standardized Assessments completed: Not Needed  ASSESSMENT: Patient currently experiencing more in-depth realizations about his feelings and experiences and connections between them that was prompted after a family game situation with stress from rules not being followed. Silvino felt like it was not fair and it is very important to him because rules are something he can control. He is also processing that he feels lonely and trapped at dad's because of constantly lacking support and feeling "double-teamed" any time he doesn't do something perfectly.  Worked on noticing connections between lack of control at dad's and with his health to need to control as much as he can outside of those. Also noticing triggers and sensitivity to disapproving looks because of feeling he has needed to stick up for himself and advocate because he has felt unheard.   Patient may benefit from continuing use of skills and supports to help manage concerns about life changes and family dynamics.  PLAN: 1. Follow up with behavioral health clinician on : 1 week 2. Behavioral recommendations: Notice when your tension is increasing. Take a break for deep breathing, meditation, or Peloton. Think about if you want to work on increasing flexibility at future sessions.  3. Referral(s): [place]  (In Clinic)  I discussed the assessment and treatment plan with the patient. They were provided an opportunity to ask questions and all were answered. They agreed with the plan and demonstrated an understanding of the instructions.   They were advised to call back or seek an in-person evaluation if the symptoms worsen or if the condition fails to improve as anticipated.  Tykesha Konicki E, LCSW

## 2019-03-10 ENCOUNTER — Ambulatory Visit (INDEPENDENT_AMBULATORY_CARE_PROVIDER_SITE_OTHER): Payer: Managed Care, Other (non HMO) | Admitting: Licensed Clinical Social Worker

## 2019-03-10 ENCOUNTER — Other Ambulatory Visit: Payer: Self-pay

## 2019-03-10 DIAGNOSIS — F4323 Adjustment disorder with mixed anxiety and depressed mood: Secondary | ICD-10-CM | POA: Diagnosis not present

## 2019-03-17 ENCOUNTER — Other Ambulatory Visit: Payer: Self-pay

## 2019-03-17 ENCOUNTER — Ambulatory Visit (INDEPENDENT_AMBULATORY_CARE_PROVIDER_SITE_OTHER): Payer: Managed Care, Other (non HMO) | Admitting: Licensed Clinical Social Worker

## 2019-03-17 DIAGNOSIS — F4323 Adjustment disorder with mixed anxiety and depressed mood: Secondary | ICD-10-CM | POA: Diagnosis not present

## 2019-03-17 NOTE — BH Specialist Note (Signed)
Integrated Behavioral Health Follow Up Visit via Telemedicine Video Visit  07/10/2018 MRN: [id number] Name: Bradley [name]  Number of [place] Clinician visits:: 24 (CCA completed) Session Start time: 9:28 AM   Session End time:  10:30 AM Total time: 62 minutes  Type of Service: [place] Interpretor:No. Interpretor Name and Language: N/A  Referring Provider: Dr. Yehuda Savannah Type of Visit: Video Patient/Family location: home [facility] Provider location: office All persons participating in visit: Bradley [name], Bradley [name]. Bradley Hestand LCSW  Discussed confidentiality: Yes   I connected with Bradley [name] by a video enabled telemedicine application and verified that I am speaking with the correct person(s).  I discussed the limitations of evaluation and management by telemedicine and the availability of in person appointments.  I discussed that the purpose of this visit is to provide behavioral health care while limiting exposure to the novel coronavirus.  Discussed there is a possibility of technology failure and discussed alternative modes of communication if that failure occurs. I discussed that engaging in this video visit, they consent to the provision of behavioral healthcare and the services will be billed under their insurance.  Patient expressed understanding and consented to video visit: Yes    PRESENTING CONCERNS: Patient and/or family reports the following symptoms/concerns: some tension at dad's house during Bradley [name]. Ongoing distress during games or situations where logic/ rules are not followed or games that are more subjective. Also wanting to work on "not bragging" as much to help maintain relationships. Bradley [name] is exercising more regularly now. Duration of problem: 6+ months; Severity of problem: mild  LIFE CONTEXT: Below is still current Family and Social: lives with mom, stepdad ("bonus" dad), brother. Dad and paternal grandparents nearby-  spends some time with them, lived there for 4 months during pandemic. Maternal grandparents and maternal aunt & her family live nearby.  School/Work: Taking a gap year 2020-2021. Working at [employer], interviewing for other jobs. Applied to [employer] for next year Self-Care: likes video games, Nature conservation officer, talking to people, cooking  GOALS ADDRESSED: Below is still current Patient will: 1. Reduce symptoms of: anxiety, depression and stress 2. Increase knowledge and/or ability of: coping skills and stress reduction  3. Demonstrate ability to: Increase healthy adjustment to current life circumstances  INTERVENTIONS: Interventions utilized: Brief CBT and Supportive Counseling  Standardized Assessments completed: Not Needed  ASSESSMENT: Patient currently experiencing ongoing work on self awareness and growth. Processed through situation during [place]. In work on [place], noticed where it comes from (often feeling lack of acknowledgement from specific people) and ways to be proud of oneself without bragging to where it interferes with relationships.   At the end of the visit, began more work on cognitive flexibility, particularly around games or situations where Bradley [name] feels logic is not being used. Discussed preparing self with coping skills for situations that cause distress and practicing flexibility by going into the game and purposefully changing something (ex: not calling time). Will discuss more at next visit.   Patient may benefit from continuing use of skills and supports to help manage concerns about life changes and family dynamics.  PLAN: 1. Follow up with behavioral health clinician on : 1 week 2. Behavioral recommendations: For bragging, remind yourself who you are with and that they do acknowledge your effort and good qualities. Bradley [name] down what you say to note that you are proud but not necessarily say every detail. For games, think about what skills you can use when  distressed (  ex: deep breathing, fidget toy, mantra, muscle relaxing). You can also try to purposefully do something different to strengthen your flexibility muscle (ex: don't call time during a game).  3. Referral(s): Counselor. Will refer out due to this clinician changing positions in 1 month  I discussed the assessment and treatment plan with the patient. They were provided an opportunity to ask questions and all were answered. They agreed with the plan and demonstrated an understanding of the instructions.   They were advised to call back or seek an in-person evaluation if the symptoms worsen or if the condition fails to improve as anticipated.  Bradley [name] E, LCSW

## 2019-03-19 ENCOUNTER — Encounter (INDEPENDENT_AMBULATORY_CARE_PROVIDER_SITE_OTHER): Payer: Self-pay | Admitting: Licensed Clinical Social Worker

## 2019-03-23 NOTE — BH Specialist Note (Signed)
Integrated Behavioral Health Follow Up Visit via Telemedicine Video Visit  07/10/2018 MRN: [id number] Name: Bradley [name]  Number of Bradley [name] Clinician visits:: 25 (CCA completed) Session Start time: 9:27 AM   Session End time:  10:32 AM Total time: 65 minutes  Type of Service: [place] Interpretor:No. Interpretor Name and Language: N/A  Referring Provider: Dr. Yehuda [name] Type of Visit: Video Patient/Family location: home [facility] Provider location: office All persons participating in visit: Bradley [name], Bradley [name]. Bradley Callens LCSW  Discussed confidentiality: Yes   I connected with Bradley [name] by a video enabled telemedicine application and verified that I am speaking with the correct person(s).  I discussed the limitations of evaluation and management by telemedicine and the availability of in person appointments.  I discussed that the purpose of this visit is to provide behavioral health care while limiting exposure to the novel coronavirus.  Discussed there is a possibility of technology failure and discussed alternative modes of communication if that failure occurs. I discussed that engaging in this video visit, they consent to the provision of behavioral healthcare and the services will be billed under their insurance.  Patient expressed understanding and consented to video visit: Yes    PRESENTING CONCERNS: Patient and/or family reports the following symptoms/concerns: ongoing work on cognitive flexibility, especially around games and group activities where rules might not be followed. Ongoing strain with relationship with dad. Duration of problem: 6+ months; Severity of problem: mild  LIFE CONTEXT: Below is still current Family and Social: lives with mom, stepdad ("bonus" dad), brother. Dad and paternal grandparents nearby- spends some time with them, lived there for 4 months during pandemic. Maternal grandparents and maternal aunt & her  family live nearby.  School/Work: Taking a gap year 2020-2021. Working at [employer], interviewing for other jobs. Applied to [employer] for next year Self-Care: likes video games, Nature conservation officer, talking to people, cooking  GOALS ADDRESSED: Below is still current Patient will: 1. Reduce symptoms of: anxiety, depression and stress 2. Increase knowledge and/or ability of: coping skills and stress reduction  3. Demonstrate ability to: Increase cognitive flexibility to assist in growing and maintaining relationships  INTERVENTIONS: Interventions utilized: Brief CBT and Psychoeducation and/or Health Education  Standardized Assessments completed: Not Needed  ASSESSMENT: Patient currently experiencing ongoing work on [employer]. Doing fairly well overall this week. Discussed an interaction with dad that occurred. Main part of visit focused on cognitive flexibility. Worked on identifying values (relationships, family, community, fairness, order) and how those may have some tension during family games and how to prioritize. Bradley [name] was able to make a good connection to when he was able to be flexible in rules with chess and how he can work to do the same with family games.   Last part of visit, discussed his intake with new therapist, what some important points are for them to know, and how to figure out his ongoing goals for the next therapeutic relationship.  Patient may benefit from continuing use of skills and supports to help manage concerns about life changes and family dynamics.  PLAN: 1. Follow up with behavioral health clinician on : 1 week 2. Behavioral recommendations: For games, remember how you were able to be flexible with rules for chess and apply that to family games. Reiterate your values (family, community). Can ask before the game starts if you are doing xyz rules.  You can also try to purposefully do something different to strengthen your flexibility muscle  (  ex: take a different route to the store).  3. Referral(s): Counselor. Has intake 03/25/19 with Bradley [name]. Think about your goals for therapy  I discussed the assessment and treatment plan with the patient. They were provided an opportunity to ask questions and all were answered. They agreed with the plan and demonstrated an understanding of the instructions.   They were advised to call back or seek an in-person evaluation if the symptoms worsen or if the condition fails to improve as anticipated.  Bradley [name] E, LCSW

## 2019-03-24 ENCOUNTER — Ambulatory Visit (INDEPENDENT_AMBULATORY_CARE_PROVIDER_SITE_OTHER): Payer: Managed Care, Other (non HMO) | Admitting: Licensed Clinical Social Worker

## 2019-03-24 ENCOUNTER — Other Ambulatory Visit: Payer: Self-pay

## 2019-03-24 DIAGNOSIS — F4323 Adjustment disorder with mixed anxiety and depressed mood: Secondary | ICD-10-CM | POA: Diagnosis not present

## 2019-03-31 ENCOUNTER — Ambulatory Visit (INDEPENDENT_AMBULATORY_CARE_PROVIDER_SITE_OTHER): Payer: Managed Care, Other (non HMO) | Admitting: Licensed Clinical Social Worker

## 2019-04-03 ENCOUNTER — Encounter (INDEPENDENT_AMBULATORY_CARE_PROVIDER_SITE_OTHER): Payer: Self-pay

## 2019-04-06 NOTE — BH Specialist Note (Signed)
Integrated Behavioral Health Follow Up Visit via Telemedicine Video Visit  04/07/19 MRN: [id number] Name: Bradley [name]  Number of Bradley [name] Clinician visits:: 26 (CCA completed) Session Start time: 8:30 AM   Session End time:  9:30 AM Total time: 60 minutes  Type of Service: [place] Interpretor:No. Interpretor Name and Language: N/A  Referring Provider: Dr. Yehuda [name] Type of Visit: Video Patient/Family location: home [facility] Provider location: office All persons participating in visit: Bradley [name], Bradley [name]. Bradley Almanzar LCSW  Discussed confidentiality: Yes   I connected with Bradley [name] by a video enabled telemedicine application and verified that I am speaking with the correct person(s).  I discussed the limitations of evaluation and management by telemedicine and the availability of in person appointments.  I discussed that the purpose of this visit is to provide behavioral health care while limiting exposure to the novel coronavirus.  Discussed there is a possibility of technology failure and discussed alternative modes of communication if that failure occurs. I discussed that engaging in this video visit, they consent to the provision of behavioral healthcare and the services will be billed under their insurance.  Patient expressed understanding and consented to video visit: Yes    PRESENTING CONCERNS: Patient and/or family reports the following symptoms/concerns: strain with relationship with dad. 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 Started sessions with new therapist and it is going well so far.  Duration of problem: 6+ months; Severity of problem: mild  LIFE CONTEXT: Below is still current Family and Social: lives with mom, stepdad ("bonus" dad), brother. Dad and paternal  grandparents nearby- spends some time with them, lived there for 4 months during pandemic. Maternal grandparents and maternal aunt & her family live nearby.  School/Work: Taking a gap year 2020-2021. Working at [employer], interviewing for other jobs. Applied to [employer] for next year Self-Care: likes video games, Nature conservation officer, talking to people, cooking  GOALS ADDRESSED: Below is still current Patient will: 1. Reduce symptoms of: anxiety, depression and stress 2. Increase knowledge and/or ability of: coping skills and stress reduction  3. Demonstrate ability to: Increase cognitive flexibility to assist in growing and maintaining relationships  INTERVENTIONS: Interventions utilized: Brief CBT and Psychoeducation and/or Health Education  Standardized Assessments completed: Not Needed  ASSESSMENT: Patient currently experiencing stressors as noted above. Discussed and processed through some of those stressors today. Bradley [name] will also be doing EMDR with newly established therapist, Bradley [name], with focus on trauma from health concerns.   Concluded services today with plan for Bradley [name] to continue ongoing therapy in the community.   PLAN: 1. Follow up with behavioral health clinician on : N/A 2. Behavioral recommendations: Continue work on cognitive flexibility by changing small things you do daily (ex: taking a certain route to the store, sitting in a different chair). Continue therapy with Bradley [name].  3. Referral(s): Counselor. - Bradley [name]. Established services to be seen weekly for EMDR and work on trauma from health concerns.   I discussed the assessment and treatment plan with the patient. They were provided an opportunity to ask questions and all were answered. They agreed with the plan and demonstrated an understanding of the instructions.   They were advised to call back or seek an in-person evaluation if the symptoms worsen or if the condition fails to  improve as anticipated.  Bradley Ollis E, LCSW

## 2019-04-07 ENCOUNTER — Ambulatory Visit (INDEPENDENT_AMBULATORY_CARE_PROVIDER_SITE_OTHER): Payer: Managed Care, Other (non HMO) | Admitting: Licensed Clinical Social Worker

## 2019-04-07 ENCOUNTER — Other Ambulatory Visit: Payer: Self-pay

## 2019-04-07 DIAGNOSIS — F4323 Adjustment disorder with mixed anxiety and depressed mood: Secondary | ICD-10-CM | POA: Diagnosis not present

## 2019-05-11 ENCOUNTER — Ambulatory Visit (INDEPENDENT_AMBULATORY_CARE_PROVIDER_SITE_OTHER): Payer: Managed Care, Other (non HMO) | Admitting: Pediatric Gastroenterology

## 2019-05-11 ENCOUNTER — Other Ambulatory Visit: Payer: Self-pay

## 2019-05-11 ENCOUNTER — Encounter (INDEPENDENT_AMBULATORY_CARE_PROVIDER_SITE_OTHER): Payer: Self-pay

## 2019-05-11 ENCOUNTER — Encounter (INDEPENDENT_AMBULATORY_CARE_PROVIDER_SITE_OTHER): Payer: Self-pay | Admitting: Pediatric Gastroenterology

## 2019-05-11 VITALS — BP 120/58 | HR 88 | Ht 63.0 in | Wt 101.2 lb

## 2019-05-11 DIAGNOSIS — K626 Ulcer of anus and rectum: Secondary | ICD-10-CM

## 2019-05-11 DIAGNOSIS — E43 Unspecified severe protein-calorie malnutrition: Secondary | ICD-10-CM

## 2019-05-11 DIAGNOSIS — F39 Unspecified mood [affective] disorder: Secondary | ICD-10-CM

## 2019-05-11 MED ORDER — PREDNISONE 10 MG PO TABS: 40.0000 mg | ORAL_TABLET | Freq: Every day | ORAL | 0 refills | Status: AC

## 2019-05-11 NOTE — Patient Instructions (Addendum)
Prednisone 40 mg daily (4 tablets, each of 10 mg) for 2 weeks Then decrease dose by 10 mg every 3 days until stop.  Please await a phone cll concerning setting up the anorectal manometry   Contact information For emergencies after hours, on holidays or weekends: call [phone] and ask for the pediatric gastroenterologist on call.  For regular business hours: Pediatric GI phone number: Eustace Moore [phone] OR Use MyChart to send messages  A special favor Our waiting list is over 2 months. Other children are waiting to be seen in our clinic. If you cannot make your next appointment, please contact us with at least 2 days notice to cancel and reschedule. Your timely phone call will allow another child to use the clinic slot.  Thank you!

## 2019-05-11 NOTE — Progress Notes (Signed)
Pediatric Gastroenterology Return Visit   REFERRING PROVIDER:  [name], MD [place],  [place] [address]   ASSESSMENT:     I had the pleasure of seeing Bradley [name], 19 y.o. male (DOB: [birth year]) who I saw in follow up today for evaluation of solitary rectal ulcer syndrome, skin tags, constipation, recurrent rectal prolapse,  s/p C difficile infection, in the context of growth hormone deficiency, off growth hormone therapy. Overall, he feels better. His weight is down.  Although we thought that an anorectal manometry could help him, and be a basis for biofeedback therapy, he had a great deal of anxiety in anticipation of undergoing anorectal manometry.  Therefore we postponed performing the test.  He now would like to attempt the test again.  He continues receiving psychotherapy and strategies to cope with stress.  Prednisone, which we prescribed for episodes of rectal pain helps to break the anxiety.  He is also on inhaled albuterol to relief rectal pain, which has helped him.  In addition, he is on perianal tacrolimus for when he has active skin tags.  He uses it rarely.  I encouraged him to continue communicating via MyChart.      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 male (DOB: 2000-11-29) who is seen in follow up for evaluation of solitary rectal ulcer syndrome with perianal inflammation. History was obtained from [place]. He feels symptoms every 2 weeks related to proctalgia. During times of wellness, he exercises and eats well. Stressors aggravate his symptoms. When he develops symptoms however, "he puts his life on hold". He believes that his pain sensitivity has increased, but he can function better than before. His last dose of  prednisone was last week, 10 mg for 4-5 days. Stools have improved ("they are better, they are longer ane wetter"). He sometimes is flatulent in the toilet, which may look bloody splashed in the toilet. The stool may be coated with blood and there is blood in the toilet water. Sometimes he has mucus in the stool. His stool is Bristol scale 3-5. Sometimes he has "leakage".  He has lost weight. He attributes his weight loss to a decreased in his appetite. He is engaging a Engineer, maintenance.  Cesario found benefit from topical tacrolimus, with reduction of his perianal tags but it burns him on first application. He uses it rarely now. He manages proctalgia with albuterol and prednisone. We are postponing the ARM due to associated anxiety and clinical improvement, but now he is more open to having the ARM.  He is staying at his mom's house. He is working at [employer] part-time and doing school work.   PAST MEDICAL HISTORY: Past Medical History:  Diagnosis Date  . ADHD (attention deficit hyperactivity disorder)   . Anxiety   . Chronic constipation   . 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 GSE (gluten-sensitive enteropathy)   . Inflammatory bowel disease   . Lactose intolerance   . Mood disorder ([place])   . Scoliosis   . Short stature    Immunization History  Administered Date(s) Administered  . HPV 9-valent 01/08/2019  . Influenza, Quadrivalent, Recombinant, Inj, Pf 01/08/2019  . Influenza-Unspecified 01/10/2019   PAST SURGICAL HISTORY: Past Surgical  History:  Procedure Laterality Date  . ADENOIDECTOMY    . COLONOSCOPY N/A 12/27/2015   Procedure: COLONOSCOPY;  Surgeon: Joycelyn Rua, MD;  Location: [place];  Service: Gastroenterology;  Laterality: N/A;  . ESOPHAGOGASTRODUODENOSCOPY N/A 12/27/2015   Procedure: ESOPHAGOGASTRODUODENOSCOPY (EGD);  Surgeon: Joycelyn Rua, MD;  Location: [place];  Service: Gastroenterology;  Laterality: N/A;  . ESOPHAGOGASTRODUODENOSCOPY N/A 08/03/2016   Procedure:  ESOPHAGOGASTRODUODENOSCOPY (EGD);  Surgeon: Joycelyn Rua, MD;  Location: [place];  Service: Gastroenterology;  Laterality: N/A;  . FLEXIBLE SIGMOIDOSCOPY  08/03/2016   Procedure: FLEXIBLE SIGMOIDOSCOPY;  Surgeon: Joycelyn Rua, MD;  Location: [facility] ENDOSCOPY;  Service: Gastroenterology;;  . tubes and adenoids     SOCIAL HISTORY: Social History   Socioeconomic History  . Marital status: Single    Spouse name: Not on file  . Number of children: Not on file  . Years of education: Not on file  . Highest education level: Not on file  Occupational History  . Not on file  Tobacco Use  . Smoking status: Never Smoker  . Smokeless tobacco: Never Used  Substance and Sexual Activity  . Alcohol use: No  . Drug use: No  . Sexual activity: Not Currently    Birth control/protection: None  Other Topics Concern  . Not on file  Social History Narrative   Lives at home with mom dad and brother. 12 th grade    Social Determinants of Health   Financial Resource Strain:   . Difficulty of Paying Living Expenses: Not on file  Food Insecurity:   . Worried About Charity fundraiser in the Last Year: Not on file  . Ran Out of Food in the Last Year: Not on file  Transportation Needs:   . Lack of Transportation (Medical): Not on file  . Lack of Transportation (Non-Medical): Not on file  Physical Activity:   . Days of Exercise per Week: Not on file  . Minutes of Exercise per Session: Not on file  Stress:   . Feeling of Stress : Not on file  Social Connections:   . Frequency of Communication with Friends and Family: Not on file  . Frequency of Social Gatherings with Friends and Family: Not on file  . Attends Religious Services: Not on file  . Active Member of Clubs or Organizations: Not on file  . Attends Archivist Meetings: Not on file  . Marital Status: Not on file   FAMILY HISTORY: family history includes Cancer in his maternal grandfather, paternal grandfather, and paternal  grandmother; Depression in his maternal aunt, maternal grandmother, and mother; Diabetes in his paternal grandfather; Hyperlipidemia in his maternal grandfather, maternal grandmother, paternal grandfather, and paternal grandmother; Hypertension in his maternal grandfather; Miscarriages / [place] in his mother; Thyroid disease in his paternal grandfather and paternal grandmother.   REVIEW OF SYSTEMS:  The balance of 12 systems reviewed is negative except as noted in the HPI.  MEDICATIONS: Current Outpatient Medications  Medication Sig Dispense Refill  . albuterol (VENTOLIN HFA) 108 (90 Base) MCG/ACT inhaler Inhale 2 puffs into the lungs every 6 (six) hours as needed for wheezing or shortness of breath. 1 Inhaler 2  . escitalopram (LEXAPRO) 20 MG tablet Take 20 mg by mouth daily.    Marland Kitchen lamoTRIgine (LAMICTAL) 150 MG tablet Take 1 tablet (150 mg total) by mouth 2 (two) times daily. 60 tablet 1  . methylphenidate 27 MG PO TB24     . traZODone (DESYREL) 50 MG tablet  No current facility-administered medications for this visit.   ALLERGIES: Trileptal [oxcarbazepine]  VITAL SIGNS: There were no vitals taken for this visit. PHYSICAL EXAM: Constitutional: Alert, no acute distress, well nourished, and well hydrated.  Mental Status: Pleasantly interactive, not anxious appearing. HEENT: PERRL, conjunctiva clear, anicteric, oropharynx clear, neck supple, no LAD. Respiratory: Clear to auscultation, unlabored breathing. Cardiac: Euvolemic, regular rate and rhythm, normal S1 and S2, no murmur. Abdomen: Soft, normal bowel sounds, non-distended, non-tender, no organomegaly or masses. Perianal/Rectal Exam: Normal position of the anus, no spine dimples, no hair tufts. Small skin tags, no hemorrhoids or fissures. Extremities: No edema, well perfused. Musculoskeletal: No joint swelling or tenderness noted, no deformities. Skin: No rashes, jaundice or skin lesions noted. Neuro: No focal deficits.     No results found for this or any previous visit (from the past 2160 hour(s)).   Francisco A. Yehuda Savannah, MD Chief, Division of Pediatric Gastroenterology Professor of Pediatrics

## 2019-06-08 ENCOUNTER — Ambulatory Visit (INDEPENDENT_AMBULATORY_CARE_PROVIDER_SITE_OTHER): Payer: Managed Care, Other (non HMO) | Admitting: Pediatric Gastroenterology

## 2019-07-07 ENCOUNTER — Other Ambulatory Visit (INDEPENDENT_AMBULATORY_CARE_PROVIDER_SITE_OTHER): Payer: Self-pay | Admitting: Pediatric Gastroenterology

## 2019-07-07 DIAGNOSIS — K6289 Other specified diseases of anus and rectum: Secondary | ICD-10-CM

## 2019-08-10 ENCOUNTER — Encounter (INDEPENDENT_AMBULATORY_CARE_PROVIDER_SITE_OTHER): Payer: Self-pay | Admitting: Pediatric Gastroenterology

## 2019-08-10 ENCOUNTER — Telehealth (INDEPENDENT_AMBULATORY_CARE_PROVIDER_SITE_OTHER): Payer: Managed Care, Other (non HMO) | Admitting: Pediatric Gastroenterology

## 2019-08-10 ENCOUNTER — Other Ambulatory Visit: Payer: Self-pay

## 2019-08-10 VITALS — BP 120/76 | HR 104 | Ht 64.29 in | Wt 103.8 lb

## 2019-08-10 DIAGNOSIS — K626 Ulcer of anus and rectum: Secondary | ICD-10-CM

## 2019-08-10 DIAGNOSIS — K594 Anal spasm: Secondary | ICD-10-CM | POA: Diagnosis not present

## 2019-08-10 DIAGNOSIS — F4322 Adjustment disorder with anxiety: Secondary | ICD-10-CM

## 2019-08-10 NOTE — Patient Instructions (Signed)
Contact information For emergencies after hours, on holidays or weekends: call [phone] and ask for the pediatric gastroenterologist on call.  For regular business hours: Pediatric GI phone number: Darlina Sicilian) [place] [phone] OR Use MyChart to send messages  A special favor Our waiting list is over 2 months. Other children are waiting to be seen in our clinic. If you cannot make your next appointment, please contact us with at least 2 days notice to cancel and reschedule. Your timely phone call will allow another child to use the clinic slot.  Thank you!

## 2019-08-10 NOTE — Progress Notes (Signed)
Pediatric Gastroenterology Return Visit   REFERRING PROVIDER:  [name], MD [place],  [place] [address]   ASSESSMENT:     I had the pleasure of seeing KALEB SEK, 19 y.o. male (DOB: [birth year]) who I saw in follow up today for evaluation of solitary rectal ulcer syndrome, skin tags, constipation, recurrent rectal prolapse,  s/p C difficile infection, in the context of growth hormone deficiency, off growth hormone therapy.  He underwent anorectal manometry at [facility] on 07/06/19 (see below), which showed evidence of pelvic floor dyssynergia. Physical therapy may help him. I discussed his results with him.  Prednisone, which we prescribed for episodes of rectal pain helps to break the anxiety.  He is also on inhaled albuterol to relief rectal pain (proctalgia fugax), which has helped him.  Perianal tacrolimus caused skin burning and he stopped it.  Overall, he is doing well, with good appetite, improved weigght, good energy and good sleep. He still has intermittent blood in stool.  I encouraged him to continue communicating via MyChart.      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 male (DOB: 07/03/2000) who is seen in follow up for evaluation of solitary rectal ulcer syndrome with perianal inflammation with skin tages. History was obtained from [place]. Overall, he is better. He is passing stool regularly in the toilet. He takes a shower after passing stool and sometimes he feels that he wants to pass stool again. He has perianal pain when he defecates sometimes, with blood coating the stool. He has a good appetite, good energy, nad has gained weight.   He is staying at his mom's house. 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 He requested a letter - he will provide a draft of the letter for me to review.  I explained the results of his ARM to [employer].Marland Kitchen   PAST MEDICAL HISTORY: Past Medical History:  Diagnosis Date  . ADHD (attention deficit hyperactivity disorder)   . Anxiety   . Chronic constipation   . 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 GSE (gluten-sensitive enteropathy)   . Inflammatory bowel disease   . Lactose intolerance   . Mood disorder ([place])   . Scoliosis   . Short stature    Immunization History  Administered Date(s) Administered  . HPV 9-valent 01/08/2019  . Hpv 07/23/2019  . Influenza, Quadrivalent, Recombinant, Inj, Pf 01/08/2019  . Influenza-Unspecified 01/10/2019   PAST SURGICAL HISTORY: Past Surgical History:  Procedure Laterality Date  . ADENOIDECTOMY    . COLONOSCOPY N/A 12/27/2015   Procedure: COLONOSCOPY;  Surgeon: Joycelyn Rua, MD;  Location: [place];  Service: Gastroenterology;  Laterality: N/A;  . ESOPHAGOGASTRODUODENOSCOPY N/A 12/27/2015   Procedure: ESOPHAGOGASTRODUODENOSCOPY (EGD);  Surgeon: Joycelyn Rua, MD;  Location: [place];  Service: Gastroenterology;  Laterality: N/A;  . ESOPHAGOGASTRODUODENOSCOPY N/A 08/03/2016   Procedure: ESOPHAGOGASTRODUODENOSCOPY (EGD);  Surgeon: Joycelyn Rua, MD;  Location: [place];  Service: Gastroenterology;  Laterality: N/A;  . FLEXIBLE SIGMOIDOSCOPY  08/03/2016   Procedure: FLEXIBLE SIGMOIDOSCOPY;  Surgeon: Joycelyn Rua, MD;  Location: [place];  Service: Gastroenterology;;  . tubes and adenoids     SOCIAL HISTORY: Social History  Socioeconomic History  . Marital status: Single    Spouse name: Not on file  . Number of children: Not on file  . Years of education: Not on file  . Highest education level: Not on file  Occupational History  . Not on file  Tobacco Use  . Smoking status: Never Smoker  . Smokeless tobacco: Never Used  Substance and Sexual Activity  . Alcohol use: No  . Drug use: No  .  Sexual activity: Not Currently    Birth control/protection: None  Other Topics Concern  . Not on file  Social History Narrative   Lives at home with mom dad and brother. 12 th grade    Social Determinants of Health   Financial Resource Strain:   . Difficulty of Paying Living Expenses:   Food Insecurity:   . Worried About Charity fundraiser in the Last Year:   . Arboriculturist in the Last Year:   Transportation Needs:   . Film/video editor (Medical):   Marland Kitchen Lack of Transportation (Non-Medical):   Physical Activity:   . Days of Exercise per Week:   . Minutes of Exercise per Session:   Stress:   . Feeling of Stress :   Social Connections:   . Frequency of Communication with Friends and Family:   . Frequency of Social Gatherings with Friends and Family:   . Attends Religious Services:   . Active Member of Clubs or Organizations:   . Attends Archivist Meetings:   Marland Kitchen Marital Status:    FAMILY HISTORY: family history includes Cancer in his maternal grandfather, paternal grandfather, and paternal grandmother; Depression in his maternal aunt, maternal grandmother, and mother; Diabetes in his paternal grandfather; Hyperlipidemia in his maternal grandfather, maternal grandmother, paternal grandfather, and paternal grandmother; Hypertension in his maternal grandfather; Miscarriages / [place] in his mother; Thyroid disease in his paternal grandfather and paternal grandmother.   REVIEW OF SYSTEMS:  The balance of 12 systems reviewed is negative except as noted in the HPI.  MEDICATIONS: Current Outpatient Medications  Medication Sig Dispense Refill  . albuterol (VENTOLIN HFA) 108 (90 Base) MCG/ACT inhaler Inhale 2 puffs into the lungs every 6 (six) hours as needed for wheezing or shortness of breath. 1 Inhaler 2  . docusate sodium (COLACE) 100 MG capsule Take by mouth.    . escitalopram (LEXAPRO) 20 MG tablet Take 20 mg by mouth daily.    Marland Kitchen lamoTRIgine (LAMICTAL) 150 MG  tablet Take 1 tablet (150 mg total) by mouth 2 (two) times daily. 60 tablet 1  . loperamide (LOPERAMIDE A-D) 2 MG tablet Take by mouth.    . methylphenidate 27 MG PO TB24     . traZODone (DESYREL) 50 MG tablet      No current facility-administered medications for this visit.   ALLERGIES: Trileptal [oxcarbazepine]  VITAL SIGNS: BP 120/76   Pulse (!) 104   Ht 5' 4.29" (1.633 m)   Wt 103 lb 12.8 oz (47.1 kg)   BMI 17.66 kg/m  PHYSICAL EXAM: Constitutional: Alert, no acute distress, well nourished, and well hydrated.  Mental Status: Pleasantly interactive, not anxious appearing. HEENT: PERRL, conjunctiva clear, anicteric, oropharynx clear, neck supple, no LAD. Respiratory: Clear to auscultation, unlabored breathing. Cardiac: Euvolemic, regular rate and rhythm, normal S1 and S2, no murmur. Abdomen: Soft, normal bowel sounds, non-distended, non-tender, no organomegaly or masses. Perianal/Rectal Exam: Normal position of the anus, no spine dimples, no hair tufts. Small skin tags, no hemorrhoids or fissures.  Extremities: No edema, well perfused. Musculoskeletal: No joint swelling or tenderness noted, no deformities. Skin: No rashes, jaundice or skin lesions noted. Neuro: No focal deficits.   Anorectal manometry 07/06/19  Impression:      - Resting study reveals a normal internal anal              sphincter pressure.             - Squeeze study reveals a normal external anal              sphincter pressure.             - RAIR (Rectoanal Inhibitory Reflex) is present              suggesting absence of Hirschsprung's disease.             - Sensation study reveals a normal first sensation              threshold with low urge and normal maximal tolerated              volume thresholds.             - Unable to expel defecation balloon.             - Elevated pelvic floor resting  activity on EMG.             - Low maximal squeeze activity on EMG.             - Strain manuever reveals an increase in pelvic floor              activity with strain.             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  Trevious Rampey A. Yehuda Savannah, MD Chief, Division of Pediatric Gastroenterology Professor of Pediatrics

## 2019-08-12 ENCOUNTER — Other Ambulatory Visit: Payer: Self-pay

## 2019-08-12 ENCOUNTER — Encounter: Payer: Self-pay | Admitting: Physical Therapy

## 2019-08-12 ENCOUNTER — Encounter (INDEPENDENT_AMBULATORY_CARE_PROVIDER_SITE_OTHER): Payer: Self-pay

## 2019-08-12 ENCOUNTER — Ambulatory Visit: Payer: Managed Care, Other (non HMO) | Attending: Pediatric Gastroenterology | Admitting: Physical Therapy

## 2019-08-12 DIAGNOSIS — R159 Full incontinence of feces: Secondary | ICD-10-CM | POA: Insufficient documentation

## 2019-08-12 DIAGNOSIS — R252 Cramp and spasm: Secondary | ICD-10-CM | POA: Diagnosis present

## 2019-08-12 DIAGNOSIS — K5900 Constipation, unspecified: Secondary | ICD-10-CM | POA: Diagnosis present

## 2019-08-12 DIAGNOSIS — M6281 Muscle weakness (generalized): Secondary | ICD-10-CM | POA: Diagnosis present

## 2019-08-12 NOTE — Therapy (Signed)
[facility] [address] [place], [place], [address] Phone: [phone]   Fax:  [phone]  Physical Therapy Evaluation  Patient Details  Name: Bradley [name] MRN: [id number] Date of Birth: 2000-08-30 Referring Provider (PT): Dr. Kandis Ban   Encounter Date: 08/12/2019  PT End of Session - 08/12/19 0931    Visit Number  1    Date for PT Re-Evaluation  11/04/19    Authorization Type  [employer]    PT Start Time  0930    PT Stop Time  1010    PT Time Calculation (min)  40 min    Activity Tolerance  Patient tolerated treatment well;No increased pain    Behavior During Therapy  WFL for tasks assessed/performed       Past Medical History:  Diagnosis Date  . ADHD (attention deficit hyperactivity disorder)   . Anxiety   . Chronic constipation   . 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 GSE (gluten-sensitive enteropathy)   . Inflammatory bowel disease   . Lactose intolerance   . Mood disorder ([place])   . Scoliosis   . Short stature     Past Surgical History:  Procedure Laterality Date  . ADENOIDECTOMY    . COLONOSCOPY N/A 12/27/2015   Procedure: COLONOSCOPY;  Surgeon: Joycelyn Rua, MD;  Location: [place];  Service: Gastroenterology;  Laterality: N/A;  . ESOPHAGOGASTRODUODENOSCOPY N/A 12/27/2015   Procedure: ESOPHAGOGASTRODUODENOSCOPY (EGD);  Surgeon: Joycelyn Rua, MD;  Location: [place];  Service: Gastroenterology;  Laterality: N/A;  . ESOPHAGOGASTRODUODENOSCOPY N/A 08/03/2016   Procedure: ESOPHAGOGASTRODUODENOSCOPY (EGD);  Surgeon: Joycelyn Rua, MD;  Location: [place];  Service: Gastroenterology;  Laterality: N/A;  . FLEXIBLE SIGMOIDOSCOPY  08/03/2016   Procedure: FLEXIBLE SIGMOIDOSCOPY;  Surgeon: Joycelyn Rua, MD;  Location: [place];  Service: Gastroenterology;;  . tubes and adenoids      There were no vitals filed for this visit.   Subjective Assessment - 08/12/19 0935    Subjective  I want  to be better. Patient wears 2-3 depends pads per day. Bowel movements 1-2 times per day and rare time is 3 per day. Strain to have a bowel movement. Sit on the commode for 45 mintues to 1 hour to have a bowel movement. No issues with urination. Most of te time pain after the bowel movement. Patient has to take a shower after a bowel movement due to toilet paper too course and bidget is too strong. Has sensitivity around the anal region.    Patient Stated Goals  Not have to strain with BM; prevent fecal incontinence and toileting to interfere with his life; not worry about his toileting    Currently in Pain?  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 Kandis Ban    Onset Date/Surgical Date  --   chronic   Prior Therapy  yes  Precautions   Precautions  None      Restrictions   Weight Bearing Restrictions  No      Balance Screen   Has the patient fallen in the past 6 months  No    Has the patient had a decrease in activity level because of a fear of falling?   No    Is the patient reluctant to leave their home because of a fear of falling?   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      Cognition   Overall Cognitive Status  Within Functional Limits for tasks assessed      Posture/Postural Control   Posture/Postural Control   Postural limitations    Posture Comments  large rib hump on the right thoracic       ROM / Strength   AROM / PROM / Strength  AROM;PROM;Strength      Strength   Right Hip Extension  4/5    Right Hip Internal Rotation  4/5    Right Hip ABduction  4/5    Right Hip ADduction  4/5    Left Hip Extension  4/5    Left Hip Internal Rotation  4/5    Left Hip ADduction  4/5      Palpation   Palpation comment  tenderness located on the anus, right levator ani                  Objective measurements completed on examination: See above findings.    Pelvic Floor Special Questions - 08/12/19 0001    Pelvic Floor Internal Exam  Patient confirms identification and approves PT to assess pelvic floor and treatment    Exam Type  Rectal    Palpation  trouble to bulge the pelvic floor and contract, tightness in the perineal body, tenderness in the EAS, IAS, and puborectalis, unable to go further into the rectum due to pain and tightness    Strength  weak squeeze, no lift       [facility] Adult PT Treatment/Exercise - 08/12/19 0001      Self-Care   Self-Care  Other Self-Care Comments    Other Self-Care Comments   education on how to massage around the anus prior to a bowel movement; how to massage the perineal body 5 minutes per day due to the thickness of the tissue      Manual Therapy   Manual Therapy  Internal Pelvic Floor    Manual therapy comments  educated patient on how to perform soft tissue work to the perineal body and massage around the anus prior to a bowel movement to relax the anus    Internal Pelvic Floor  worked on the anterior pelvic floor muscles through the anus             PT Education - 08/12/19 1002    Education Details  educated patient on how to massage the anus prior to bowel movement and massage the perineal body 5 mintues daily to improve the elongation of the tissue       PT Short Term Goals - 08/12/19 1423      PT SHORT TERM GOAL #1   Title   independent with initial  HEP    Time  4    Period  Weeks    Status  New    Target Date  09/09/19      PT SHORT TERM GOAL #2   Title  rectal pain with bowel movement decreased >/= 25%    Baseline  --    Time  4    Period  Weeks    Status  New    Target Date  09/09/19      PT SHORT TERM GOAL #3   Title  understand abdominal massage to move stool through the intestines and reduce straining with bowel movement    Time  4    Period  Weeks    Status  New    Target Date  09/09/19      PT SHORT TERM GOAL #4   Title  ---    Baseline  ---        PT Long Term Goals - 08/12/19 1424      PT LONG TERM GOAL #1   Title  indpendent with HEP and understand how to progress himself    Time  12    Period  Weeks    Status  New    Target Date  11/04/19      PT LONG TERM GOAL #2   Title  rectal pain with sitting and after a bowel movement decreased >/= 75%    Baseline  --    Time  12    Period  Weeks    Status  New    Target Date  11/04/19      PT LONG TERM GOAL #3   Title  straining to have a bowel movement decreased >/= 75% due to ability to relax the pelvic floor and use the lower abdominals to assist    Baseline  --    Time  12    Period  Weeks    Status  New    Target Date  11/04/19      PT LONG TERM GOAL #4   Title  fecal leakage decreased >/= 75% so he not having to wear a 1 or less depends per day    Baseline  --    Time  12    Period  Weeks    Status  New    Target Date  11/04/19      PT LONG TERM GOAL #5   Title  stand for long period of time with rectal pain decreased >/= 505 and his legs feel weak minimally    Time  12    Period  Weeks    Status  New    Target Date  11/04/19             Plan - 08/12/19 1007    Clinical Impression Statement  Patient is a 19 year old male with 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 Patient pain level is 5/10 after a bowel movement, sitting for long periods of time or on a hard surface When Patient  stands for a long period of time his legs feel weak. Patient will be on the commode for 45 minutes to 1 hour to have a bowel movement. Patient will strain to have a bowel movements. He wears 2-3 depends in a 24 hour period. After a bowel movement he has to take a shower due to the sensitivity of the anal area. Pelvic floor strength is 2/5. He has difficulty with bulging and contracting  the pelvic floor. Thickness in the perineal area. Tenderness in the external anal sphincter, internal anal sphincter and puborectalis. Therapist not able to place her index finger further than the puborectalis due to tightness in the area. Bilateral hip strength is 4/5. Patient will benefit from skilled therapy to reduce his pain and improve coordination of the pelvic floor muscles to reduce fecal leakage.    Personal Factors and Comorbidities  Comorbidity 3+    Comorbidities  ADHD, mood disorder; inflammatory bowel disease    Examination-Activity Limitations  Continence;Toileting;Stand;Sit    Examination-Participation Restrictions  Community Activity;Driving    Stability/Clinical Decision Making  Evolving/Moderate complexity    Clinical Decision Making  Moderate    Rehab Potential  Good    PT Frequency  1x / week    PT Duration  12 weeks    PT Treatment/Interventions  Biofeedback;Cryotherapy;Electrical Stimulation;Moist Heat;Neuromuscular re-education;Therapeutic activities;Patient/family education;Manual techniques;Dry needling    PT Next Visit Plan  toileting technique review, bulging of the pelvic floor, using a ball to massage the pelvic floor, hip stretches; pelvic floor EMG for relaxation    Consulted and Agree with Plan of Care  Patient       Patient will benefit from skilled therapeutic intervention in order to improve the following deficits and impairments:  Decreased coordination, Increased fascial restricitons, Pain, Decreased endurance, Decreased activity tolerance, Decreased strength  Visit  Diagnosis: Cramp and spasm - Plan: PT plan of care cert/re-cert  Muscle weakness (generalized) - Plan: PT plan of care cert/re-cert  Fecal incontinence alternating with constipation - Plan: PT plan of care cert/re-cert     Problem List Patient Active Problem List   Diagnosis Date Noted  . Unspecified severe protein-calorie malnutrition ([place]) 10/13/2018  . Crohn's disease with rectal bleeding ([place]) 10/13/2018  . Iatrogenic adrenal insufficiency ([place]) 05/20/2018  . Constipation 10/14/2017  . Solitary rectal ulcer syndrome 10/14/2017  . Idiopathic scoliosis 12/13/2015  . Growth hormone deficiency ([place]) 12/01/2013  . Abnormal renal ultrasound 11/27/2013  . Lack of expected normal physiological development 06/18/2013  . ADHD (attention deficit hyperactivity disorder)   . Mood disorder ([place])   . Short stature   . Psoriasis 02/27/2012  . 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 History of constipation     Earlie Counts, PT 08/12/19 2:28 PM    [facility] [address], [place], [place], [address] Phone: [phone]   Fax:  [phone]  Name: Bradley [name] MRN: [id number] Date of Birth: October 12, 2000

## 2019-08-13 ENCOUNTER — Encounter (INDEPENDENT_AMBULATORY_CARE_PROVIDER_SITE_OTHER): Payer: Self-pay

## 2019-08-26 ENCOUNTER — Telehealth: Payer: Self-pay | Admitting: Physical Therapy

## 2019-08-26 ENCOUNTER — Ambulatory Visit: Payer: Managed Care, Other (non HMO) | Admitting: Physical Therapy

## 2019-08-26 NOTE — Telephone Encounter (Signed)
Called patient and left a message. He no-showed for his appointment today at 9:30.  Earlie Counts, PT @5 /26/2021@ 10:15 AM

## 2019-08-31 ENCOUNTER — Encounter (INDEPENDENT_AMBULATORY_CARE_PROVIDER_SITE_OTHER): Payer: Self-pay

## 2019-08-31 DIAGNOSIS — K626 Ulcer of anus and rectum: Secondary | ICD-10-CM

## 2019-09-01 MED ORDER — PREDNISONE 10 MG PO TABS: ORAL_TABLET | ORAL | 5 refills | Status: DC

## 2019-09-16 ENCOUNTER — Ambulatory Visit: Payer: Managed Care, Other (non HMO) | Attending: Pediatric Gastroenterology | Admitting: Physical Therapy

## 2019-09-16 ENCOUNTER — Other Ambulatory Visit: Payer: Self-pay

## 2019-09-16 ENCOUNTER — Encounter: Payer: Self-pay | Admitting: Physical Therapy

## 2019-09-16 ENCOUNTER — Telehealth (INDEPENDENT_AMBULATORY_CARE_PROVIDER_SITE_OTHER): Payer: Self-pay | Admitting: Pediatric Gastroenterology

## 2019-09-16 ENCOUNTER — Encounter (INDEPENDENT_AMBULATORY_CARE_PROVIDER_SITE_OTHER): Payer: Self-pay

## 2019-09-16 DIAGNOSIS — R159 Full incontinence of feces: Secondary | ICD-10-CM | POA: Insufficient documentation

## 2019-09-16 DIAGNOSIS — M6281 Muscle weakness (generalized): Secondary | ICD-10-CM | POA: Diagnosis present

## 2019-09-16 DIAGNOSIS — K5900 Constipation, unspecified: Secondary | ICD-10-CM | POA: Diagnosis present

## 2019-09-16 DIAGNOSIS — R252 Cramp and spasm: Secondary | ICD-10-CM | POA: Insufficient documentation

## 2019-09-16 NOTE — Telephone Encounter (Signed)
  Who's calling (name and relationship to patient) : Bradley [name] ( patient)  Best contact number: [phone]  Provider they see: Dr. Yehuda Savannah   Reason for call: Patient LVM stating he is trying to fill out his ACT and needs assistance with some of the paperwork and would like to ask a few [place]  Name of prescription:  Pharmacy:

## 2019-09-16 NOTE — Patient Instructions (Addendum)
Slow Contraction: Gravity Eliminated (Side-Lying)    Lie on  side, hips and knees slightly bent. Slowly squeeze pelvic floor for _5__ seconds. Rest for _20__ seconds. Repeat __5_ times. Do _3__ times a day.   Copyright  VHI. All rights reserved.  Prior to and after a bowel movement massage around the anus.  Do 5 quick contractions after a bowel movement [facility] [address] [place], [place] [address] Phone # [phone] Fax [phone]

## 2019-09-16 NOTE — Telephone Encounter (Signed)
Called and spoke to [place]. He wasn't sure how to describe the questions he has about his ACT form so he relayed that he will send the link to My Chart. Will continue from there when links are received.

## 2019-09-16 NOTE — Therapy (Signed)
[facility] [address] [place], [place], [address] Phone: [phone]   Fax:  [phone]  Physical Therapy Treatment  Patient Details  Name: Bradley [name] MRN: [id number] Date of Birth: Aug 20, 2000 Referring Provider (PT): Dr. Kandis Ban   Encounter Date: 09/16/2019   PT End of Session - 09/16/19 0836    Visit Number 2    Date for PT Re-Evaluation 11/04/19    Authorization Type [employer]    PT Start Time 0805    PT Stop Time 3009    PT Time Calculation (min) 38 min    Activity Tolerance Patient tolerated treatment well;No increased pain    Behavior During Therapy WFL for tasks assessed/performed           Past Medical History:  Diagnosis Date  . ADHD (attention deficit hyperactivity disorder)   . Anxiety   . Chronic constipation   . 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 GSE (gluten-sensitive enteropathy)   . Inflammatory bowel disease   . Lactose intolerance   . Mood disorder ([place])   . Scoliosis   . Short stature     Past Surgical History:  Procedure Laterality Date  . ADENOIDECTOMY    . COLONOSCOPY N/A 12/27/2015   Procedure: COLONOSCOPY;  Surgeon: Joycelyn Rua, MD;  Location: [place];  Service: Gastroenterology;  Laterality: N/A;  . ESOPHAGOGASTRODUODENOSCOPY N/A 12/27/2015   Procedure: ESOPHAGOGASTRODUODENOSCOPY (EGD);  Surgeon: Joycelyn Rua, MD;  Location: [place];  Service: Gastroenterology;  Laterality: N/A;  . ESOPHAGOGASTRODUODENOSCOPY N/A 08/03/2016   Procedure: ESOPHAGOGASTRODUODENOSCOPY (EGD);  Surgeon: Joycelyn Rua, MD;  Location: [place];  Service: Gastroenterology;  Laterality: N/A;  . FLEXIBLE SIGMOIDOSCOPY  08/03/2016   Procedure: FLEXIBLE SIGMOIDOSCOPY;  Surgeon: Joycelyn Rua, MD;  Location: [place];  Service: Gastroenterology;;  . tubes and adenoids      There were no vitals filed for this visit.   Subjective Assessment - 09/16/19 0806    Subjective I have  updated my medicine and it is better. 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 I am enjoying it. This morning I was able to go to the bathroom in 3 minutes. The fecal leakage is 60% better.    Patient Stated Goals Not have to strain with BM; prevent fecal incontinence and toileting to interfere with his life; not worry about his toileting    Currently in Pain? Yes    Pain Score 4     Pain Location Rectum    Pain Orientation Mid    Pain Descriptors / Indicators Throbbing    Pain Type Chronic pain    Pain Onset More than a month ago    Pain Frequency Intermittent    Aggravating Factors  happens after using the bathroom    Pain Relieving Factors predinsone, alleve, immodium    Multiple Pain Sites No                          Pelvic Floor Special Questions - 09/16/19 0001    Biofeedback sidely resting tone is 1 uv, , working on contracting for 5 seconds wiht breathing above 8 sec with consistence on keeping above the threshold    Biofeedback sensor type Surface   rectal            [facility] Adult PT Treatment/Exercise - 09/16/19 0001      Therapeutic Activites    Therapeutic Activities Other Therapeutic Activities    Other Therapeutic Activities sitting  with squatty potty to do diaphragmatic breathing to relax the pelvic floor to reduce strainig. Needs verbal cues and practice. When push bowel out do a deep grr to push the bowel out       Neuro Re-ed    Neuro Re-ed Details  diaphragmatic breathiing with expanding his lower rib cage then abdomen in dupine and sitting to prepare for bowel movements                  PT Education - 09/16/19 0846    Education Details toileting, pelvic floor contraction, breathing    Person(s) Educated Patient    Methods Explanation;Demonstration;Verbal cues;Handout    Comprehension Returned demonstration;Verbalized understanding            PT Short Term Goals - 08/12/19 1423      PT SHORT TERM GOAL #1   Title  independent with initial HEP    Time 4    Period Weeks    Status New    Target Date 09/09/19      PT SHORT TERM GOAL #2   Title rectal pain with bowel movement decreased >/= 25%    Baseline --    Time 4    Period Weeks    Status New    Target Date 09/09/19      PT SHORT TERM GOAL #3   Title understand abdominal massage to move stool through the intestines and reduce straining with bowel movement    Time 4    Period Weeks    Status New    Target Date 09/09/19      PT SHORT TERM GOAL #4   Title ---    Baseline ---             PT Long Term Goals - 08/12/19 1424      PT LONG TERM GOAL #1   Title indpendent with HEP and understand how to progress himself    Time 12    Period Weeks    Status New    Target Date 11/04/19      PT LONG TERM GOAL #2   Title rectal pain with sitting and after a bowel movement decreased >/= 75%    Baseline --    Time 12    Period Weeks    Status New    Target Date 11/04/19      PT LONG TERM GOAL #3   Title straining to have a bowel movement decreased >/= 75% due to ability to relax the pelvic floor and use the lower abdominals to assist    Baseline --    Time 12    Period Weeks    Status New    Target Date 11/04/19      PT LONG TERM GOAL #4   Title fecal leakage decreased >/= 75% so he not having to wear a 1 or less depends per day    Baseline --    Time 12    Period Weeks    Status New    Target Date 11/04/19      PT LONG TERM GOAL #5   Title stand for long period of time with rectal pain decreased >/= 505 and his legs feel weak minimally    Time 12    Period Weeks    Status New    Target Date 11/04/19                 Plan - 09/16/19 0836    Clinical Impression  Statement Patient resting tone in sidely is 1 uv. He is able to contract quickly with good control. Patient has difficulty with breathing while contracting for 5 seconds and unable to keep it consistently above 8 uv. Patient is able to do diphragmatic  breathing  in supine and sitting after several tries. Patient is able to contract better if he rests for 20 seconds prior to his next contraction. Patient has a small rectal prolapse. Patient reports his fecal leakage is 60% better. Patient still has rectal pai nafter a bowel movement. Patient will beneftt from skilled therapy to reduce his pain and improve coordination of the pelvic floor muscles to reduce fecal leakage and pain.    Personal Factors and Comorbidities Comorbidity 3+    Comorbidities ADHD, mood disorder; inflammatory bowel disease    Examination-Activity Limitations Continence;Toileting;Stand;Sit    Examination-Participation Restrictions Community Activity;Driving    Stability/Clinical Decision Making Evolving/Moderate complexity    Rehab Potential Good    PT Frequency 1x / week    PT Duration 12 weeks    PT Treatment/Interventions Biofeedback;Cryotherapy;Electrical Stimulation;Moist Heat;Neuromuscular re-education;Therapeutic activities;Patient/family education;Manual techniques;Dry needling    PT Next Visit Plan use the biofeedback with contraction 5 seconds,  above 8-10 uv; see how he is doing with toileting, lower abdominal contraction    Recommended Other Services MD signed initial eval    Consulted and Agree with Plan of Care Patient           Patient will benefit from skilled therapeutic intervention in order to improve the following deficits and impairments:  Decreased coordination, Increased fascial restricitons, Pain, Decreased endurance, Decreased activity tolerance, Decreased strength  Visit Diagnosis: Cramp and spasm  Muscle weakness (generalized)  Fecal incontinence alternating with constipation     Problem List Patient Active Problem List   Diagnosis Date Noted  . Unspecified severe protein-calorie malnutrition ([place]) 10/13/2018  . Crohn's disease with rectal bleeding ([place]) 10/13/2018  . Iatrogenic adrenal insufficiency ([place]) 05/20/2018  .  Constipation 10/14/2017  . Solitary rectal ulcer syndrome 10/14/2017  . Idiopathic scoliosis 12/13/2015  . Growth hormone deficiency ([place]) 12/01/2013  . Abnormal renal ultrasound 11/27/2013  . Lack of expected normal physiological development 06/18/2013  . ADHD (attention deficit hyperactivity disorder)   . Mood disorder ([place])   . Short stature   . Psoriasis 02/27/2012  . 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 History of constipation     Earlie Counts, PT 09/16/19 8:47 AM   [facility] [address] [place], [place], [address] Phone: [phone]   Fax:  [phone]  Name: KHRIS JANSSON MRN: [id number] Date of Birth: 2000/11/23

## 2019-09-17 ENCOUNTER — Encounter (INDEPENDENT_AMBULATORY_CARE_PROVIDER_SITE_OTHER): Payer: Self-pay

## 2019-09-18 ENCOUNTER — Encounter (INDEPENDENT_AMBULATORY_CARE_PROVIDER_SITE_OTHER): Payer: Self-pay

## 2019-09-18 ENCOUNTER — Other Ambulatory Visit (INDEPENDENT_AMBULATORY_CARE_PROVIDER_SITE_OTHER): Payer: Self-pay

## 2019-09-18 DIAGNOSIS — K626 Ulcer of anus and rectum: Secondary | ICD-10-CM

## 2019-09-18 MED ORDER — PREDNISONE 10 MG PO TABS: ORAL_TABLET | ORAL | 6 refills | Status: DC

## 2019-09-23 ENCOUNTER — Encounter: Payer: Self-pay | Admitting: Physical Therapy

## 2019-09-23 ENCOUNTER — Ambulatory Visit: Payer: Managed Care, Other (non HMO) | Admitting: Physical Therapy

## 2019-09-23 ENCOUNTER — Other Ambulatory Visit: Payer: Self-pay

## 2019-09-23 DIAGNOSIS — M6281 Muscle weakness (generalized): Secondary | ICD-10-CM

## 2019-09-23 DIAGNOSIS — R159 Full incontinence of feces: Secondary | ICD-10-CM

## 2019-09-23 DIAGNOSIS — R252 Cramp and spasm: Secondary | ICD-10-CM | POA: Diagnosis not present

## 2019-09-23 DIAGNOSIS — K5900 Constipation, unspecified: Secondary | ICD-10-CM

## 2019-09-23 NOTE — Therapy (Signed)
[facility] [address], [place], [place], [address] Phone: [phone]   Fax:  [phone]  Physical Therapy Treatment  Patient Details  Name: Bradley [name] MRN: [id number] Date of Birth: [birth year] Referring Provider (PT): Dr. Kandis Ban   Encounter Date: 09/23/2019   PT End of Session - 09/23/19 0804    Visit Number 3    Date for PT Re-Evaluation 11/04/19    Authorization Type [employer]    PT Start Time 0800    PT Stop Time 0840    PT Time Calculation (min) 40 min    Activity Tolerance Patient tolerated treatment well;No increased pain    Behavior During Therapy WFL for tasks assessed/performed           Past Medical History:  Diagnosis Date  . ADHD (attention deficit hyperactivity disorder)   . Anxiety   . Chronic constipation   . 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 GSE (gluten-sensitive enteropathy)   . Inflammatory bowel disease   . Lactose intolerance   . Mood disorder ([place])   . Scoliosis   . Short stature     Past Surgical History:  Procedure Laterality Date  . ADENOIDECTOMY    . COLONOSCOPY N/A 12/27/2015   Procedure: COLONOSCOPY;  Surgeon: Joycelyn Rua, MD;  Location: [facility];  Service: Gastroenterology;  Laterality: N/A;  . ESOPHAGOGASTRODUODENOSCOPY N/A 12/27/2015   Procedure: ESOPHAGOGASTRODUODENOSCOPY (EGD);  Surgeon: Joycelyn Rua, MD;  Location: [facility];  Service: Gastroenterology;  Laterality: N/A;  . ESOPHAGOGASTRODUODENOSCOPY N/A 08/03/2016   Procedure: ESOPHAGOGASTRODUODENOSCOPY (EGD);  Surgeon: Joycelyn Rua, MD;  Location: [facility];  Service: Gastroenterology;  Laterality: N/A;  . FLEXIBLE SIGMOIDOSCOPY  08/03/2016   Procedure: FLEXIBLE SIGMOIDOSCOPY;  Surgeon: Joycelyn Rua, MD;  Location: [facility];  Service: Gastroenterology;;  . tubes and adenoids      There were no vitals filed for this visit.   Subjective Assessment - 09/23/19 0804    Subjective I have  been really sore throughout the day. I use the coping mechanisms. I got a new script for Predisone for inflammation around the rectum. I use the contracting the rectumbut it is hard due to pain. I have not done the massage due to having to go to the bathroom right away. I have my bowel movements before work. When have rectal pain he will limp or waddell.    Patient Stated Goals Not have to strain with BM; prevent fecal incontinence and toileting to interfere with his life; not worry about his toileting    Currently in Pain? Yes    Pain Score 4    during the day 7/10   Pain Location Rectum    Pain Orientation Mid    Pain Descriptors / Indicators Throbbing    Pain Type Chronic pain    Pain Onset More than a month ago    Pain Frequency Intermittent    Aggravating Factors  happens after uring the bathroom, activity    Pain Relieving Factors predisone, alleve, immodium                          Pelvic Floor Special Questions - 09/23/19 0001    Biofeedback sidely resting tone started at 5 uv then doing deep breathing to decrease to  4 uv, sidely contract above 15 uv without slowly decreasing, sidely going up steps then relax; contract 5 seconds above 15 uv    Biofeedback sensor type Surface   rectal  [place] Adult PT Treatment/Exercise - 09/23/19 0001      Self-Care   Self-Care Other Self-Care Comments    Other Self-Care Comments  use ice to sit on to decrease pain and inflammation      Therapeutic Activites    Therapeutic Activities Other Therapeutic Activities    Other Therapeutic Activities sitting while driving and looking at a different seat that will help him sit straight instead of sideways                  PT Education - 09/23/19 0838    Education Details sitting on ice, correcting sitting position while driving    Person(s) Educated Patient    Methods Explanation;Demonstration    Comprehension Verbalized understanding;Returned demonstration             PT Short Term Goals - 09/23/19 0840      PT SHORT TERM GOAL #1   Title independent with initial HEP    Time 4    Period Weeks    Status Achieved      PT SHORT TERM GOAL #2   Title rectal pain with bowel movement decreased >/= 25%    Baseline still the same and amplified through the day    Time 4    Period Weeks    Status On-going      PT SHORT TERM GOAL #3   Title understand abdominal massage to move stool through the intestines and reduce straining with bowel movement    Time 4    Period Weeks    Status Achieved             PT Long Term Goals - 08/12/19 1424      PT LONG TERM GOAL #1   Title indpendent with HEP and understand how to progress himself    Time 12    Period Weeks    Status New    Target Date 11/04/19      PT LONG TERM GOAL #2   Title rectal pain with sitting and after a bowel movement decreased >/= 75%    Baseline --    Time 12    Period Weeks    Status New    Target Date 11/04/19      PT LONG TERM GOAL #3   Title straining to have a bowel movement decreased >/= 75% due to ability to relax the pelvic floor and use the lower abdominals to assist    Baseline --    Time 12    Period Weeks    Status New    Target Date 11/04/19      PT LONG TERM GOAL #4   Title fecal leakage decreased >/= 75% so he not having to wear a 1 or less depends per day    Baseline --    Time 12    Period Weeks    Status New    Target Date 11/04/19      PT LONG TERM GOAL #5   Title stand for long period of time with rectal pain decreased >/= 505 and his legs feel weak minimally    Time 12    Period Weeks    Status New    Target Date 11/04/19                 Plan - 09/23/19 0817    Clinical Impression Statement Patient is able to contract for 5 seconds in sidely above 15 uv with counting out loud. He is  able to fully relax and contract quickly. After exercise with the EMG unit, patient felt sore. Discussed with patient on sitting on ice to reduce  the soreness and inflammation during the day aorund the rectum. Patient will sit sideways in his car when he is havig pain in the rectum putting unbalanced pressure on the spine. Patient is able to do diaphragmatic breathing to relax the pelvic floor. Patient will benefit from skilled therapy to reduce his pain and improve coordination of the pelvic floor muscls to reduce fecal leakage and pain.    Personal Factors and Comorbidities Comorbidity 3+    Comorbidities ADHD, mood disorder; inflammatory bowel disease    Examination-Activity Limitations Continence;Toileting;Stand;Sit    Examination-Participation Restrictions Community Activity;Driving    Stability/Clinical Decision Making Evolving/Moderate complexity    Rehab Potential Good    PT Frequency 1x / week    PT Duration 12 weeks    PT Treatment/Interventions Biofeedback;Cryotherapy;Electrical Stimulation;Moist Heat;Neuromuscular re-education;Therapeutic activities;Patient/family education;Manual techniques;Dry needling    PT Next Visit Plan work on bulging the pelvic floor, lower abdominal contraction, pelvic floor EMG in sidely with step, elevator for control    Consulted and Agree with Plan of Care Patient           Patient will benefit from skilled therapeutic intervention in order to improve the following deficits and impairments:  Decreased coordination, Increased fascial restricitons, Pain, Decreased endurance, Decreased activity tolerance, Decreased strength  Visit Diagnosis: Cramp and spasm  Muscle weakness (generalized)  Fecal incontinence alternating with constipation     Problem List Patient Active Problem List   Diagnosis Date Noted  . Unspecified severe protein-calorie malnutrition ([place]) 10/13/2018  . Crohn's disease with rectal bleeding ([place]) 10/13/2018  . Iatrogenic adrenal insufficiency ([place]) 05/20/2018  . Constipation 10/14/2017  . Solitary rectal ulcer syndrome 10/14/2017  . Idiopathic scoliosis 12/13/2015    . Growth hormone deficiency ([place]) 12/01/2013  . Abnormal renal ultrasound 11/27/2013  . Lack of expected normal physiological development 06/18/2013  . ADHD (attention deficit hyperactivity disorder)   . Mood disorder ([place])   . Short stature   . Psoriasis 02/27/2012  . 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 History of constipation     Earlie Counts, PT 09/23/19 8:42 AM   [facility] [address], [place], [place], [address] Phone: [phone]   Fax:  [phone]  Name: JUSTYN LANGHAM MRN: [id number] Date of Birth: 09/07/2000

## 2019-09-30 ENCOUNTER — Encounter (INDEPENDENT_AMBULATORY_CARE_PROVIDER_SITE_OTHER): Payer: Self-pay

## 2019-09-30 ENCOUNTER — Ambulatory Visit: Payer: Managed Care, Other (non HMO) | Admitting: Physical Therapy

## 2019-09-30 ENCOUNTER — Encounter: Payer: Self-pay | Admitting: Physical Therapy

## 2019-09-30 ENCOUNTER — Other Ambulatory Visit: Payer: Self-pay

## 2019-09-30 DIAGNOSIS — R252 Cramp and spasm: Secondary | ICD-10-CM

## 2019-09-30 DIAGNOSIS — K5900 Constipation, unspecified: Secondary | ICD-10-CM

## 2019-09-30 DIAGNOSIS — R159 Full incontinence of feces: Secondary | ICD-10-CM

## 2019-09-30 DIAGNOSIS — M6281 Muscle weakness (generalized): Secondary | ICD-10-CM

## 2019-09-30 NOTE — Patient Instructions (Signed)
Access Code: [email or web address] URL: [email or web address] Date: 09/30/2019 Prepared by: Earlie Counts  Exercises Seated Piriformis Stretch with Trunk Bend - 1 x daily - 7 x weekly - 1 sets - 2 reps - 30 sec hold Seated Hamstring Stretch - 1 x daily - 7 x weekly - 2 sets - 2 reps - 30 sec hold Standing ITB Stretch - 1 x daily - 7 x weekly - 1 sets - 2 reps - 15 sec hold Supine Abdominal Wall Massage - 1 x daily - 7 x weekly - 1 sets - 10 reps [facility] [address] [place], [place] [address] Phone # [phone] Fax [phone]

## 2019-09-30 NOTE — Therapy (Signed)
[facility] [address] [place], [place], [address] Phone: [phone]   Fax:  [phone]  Physical Therapy Treatment  Patient Details  Name: Bradley [name] MRN: [id number] Date of Birth: 2000/09/16 Referring Provider (PT): Dr. Kandis Ban   Encounter Date: 09/30/2019   PT End of Session - 09/30/19 0815    Visit Number 4    Date for PT Re-Evaluation 11/04/19    Authorization Type [employer]    PT Start Time 0800    PT Stop Time 0840    PT Time Calculation (min) 40 min    Activity Tolerance Patient tolerated treatment well;No increased pain    Behavior During Therapy WFL for tasks assessed/performed           Past Medical History:  Diagnosis Date  . ADHD (attention deficit hyperactivity disorder)   . Anxiety   . Chronic constipation   . 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 GSE (gluten-sensitive enteropathy)   . Inflammatory bowel disease   . Lactose intolerance   . Mood disorder ([place])   . Scoliosis   . Short stature     Past Surgical History:  Procedure Laterality Date  . ADENOIDECTOMY    . COLONOSCOPY N/A 12/27/2015   Procedure: COLONOSCOPY;  Surgeon: Joycelyn Rua, MD;  Location: [facility];  Service: Gastroenterology;  Laterality: N/A;  . ESOPHAGOGASTRODUODENOSCOPY N/A 12/27/2015   Procedure: ESOPHAGOGASTRODUODENOSCOPY (EGD);  Surgeon: Joycelyn Rua, MD;  Location: [facility];  Service: Gastroenterology;  Laterality: N/A;  . ESOPHAGOGASTRODUODENOSCOPY N/A 08/03/2016   Procedure: ESOPHAGOGASTRODUODENOSCOPY (EGD);  Surgeon: Joycelyn Rua, MD;  Location: [facility];  Service: Gastroenterology;  Laterality: N/A;  . FLEXIBLE SIGMOIDOSCOPY  08/03/2016   Procedure: FLEXIBLE SIGMOIDOSCOPY;  Surgeon: Joycelyn Rua, MD;  Location: [facility];  Service: Gastroenterology;;  . tubes and adenoids      There were no vitals filed for this visit.   Subjective Assessment - 09/30/19 0806    Subjective I had some  leakage but did not see it. I think it was blood I felt. . I had some blood. I have soreness and tightness in the left buttocks. I am now using a pillow to sit on and feel musch better.    Patient Stated Goals Not have to strain with BM; prevent fecal incontinence and toileting to interfere with his life; not worry about his toileting    Currently in Pain? Yes    Pain Score 1     Pain Location Rectum    Pain Orientation Mid    Pain Descriptors / Indicators Throbbing    Pain Type Chronic pain    Pain Onset More than a month ago    Pain Frequency Intermittent    Aggravating Factors  happens after using the bathroom, activity    Pain Relieving Factors Predisone, alleve, immodium    Multiple Pain Sites No                          Pelvic Floor Special Questions - 09/30/19 0001    Biofeedback resting tone high today at 8 uv and difficulty with relaxing, sidely ramp  to 24 uv ; worked on ramp today. stopped due to increase in rectal pain and tension    Biofeedback sensor type Surface   rectal            [facility] Adult PT Treatment/Exercise - 09/30/19 0001      Self-Care   Self-Care Other Self-Care Comments  Other Self-Care Comments  educated patient on abdominal massage and to do daily to assist in stool moving through the intestines      Lumbar Exercises: Stretches   Active Hamstring Stretch Right;Left;1 rep;30 seconds    Active Hamstring Stretch Limitations sitting    ITB Stretch Right;Left;1 rep;30 seconds    ITB Stretch Limitations standing    Piriformis Stretch Right;Left;1 rep;30 seconds    Piriformis Stretch Limitations 30 sec                  PT Education - 09/30/19 0841    Education Details Access Code: [email or web address]    Person(s) Educated Patient    Methods Explanation;Demonstration;Verbal cues;Handout    Comprehension Verbalized understanding;Returned demonstration            PT Short Term Goals - 09/30/19 0835      PT SHORT TERM GOAL #1    Title independent with initial HEP    Time 4    Period Weeks    Status Achieved    Target Date 09/09/19      PT SHORT TERM GOAL #2   Title rectal pain with bowel movement decreased >/= 25%    Baseline 15% better    Time 4    Period Weeks    Status On-going    Target Date 09/09/19      PT SHORT TERM GOAL #3   Title understand abdominal massage to move stool through the intestines and reduce straining with bowel movement    Time 4    Period Weeks    Status Achieved    Target Date 09/09/19             PT Long Term Goals - 08/12/19 1424      PT LONG TERM GOAL #1   Title indpendent with HEP and understand how to progress himself    Time 12    Period Weeks    Status New    Target Date 11/04/19      PT LONG TERM GOAL #2   Title rectal pain with sitting and after a bowel movement decreased >/= 75%    Baseline --    Time 12    Period Weeks    Status New    Target Date 11/04/19      PT LONG TERM GOAL #3   Title straining to have a bowel movement decreased >/= 75% due to ability to relax the pelvic floor and use the lower abdominals to assist    Baseline --    Time 12    Period Weeks    Status New    Target Date 11/04/19      PT LONG TERM GOAL #4   Title fecal leakage decreased >/= 75% so he not having to wear a 1 or less depends per day    Baseline --    Time 12    Period Weeks    Status New    Target Date 11/04/19      PT LONG TERM GOAL #5   Title stand for long period of time with rectal pain decreased >/= 505 and his legs feel weak minimally    Time 12    Period Weeks    Status New    Target Date 11/04/19                 Plan - 09/30/19 0810    Clinical Impression Statement Patient reports his rectal pain is 15% better with bowel  movements. Patient felt he had some leakage but it was blood. Patient is now sitting on a pillow and it makes a difference in his rectal pain. Patient has learned stretches to do for his hips and back. Patient has reviewed  with therapist on abdominal massage. Patient will benefit from skilled therapy to imporve coordination of the pelvic floor muscles to reduce fecal leakage and pain.    Personal Factors and Comorbidities Comorbidity 3+    Comorbidities ADHD, mood disorder; inflammatory bowel disease    Examination-Activity Limitations Continence;Toileting;Stand;Sit    Examination-Participation Restrictions Community Activity;Driving    Stability/Clinical Decision Making Evolving/Moderate complexity    Rehab Potential Good    PT Frequency 1x / week    PT Duration 12 weeks    PT Treatment/Interventions Biofeedback;Cryotherapy;Electrical Stimulation;Moist Heat;Neuromuscular re-education;Therapeutic activities;Patient/family education;Manual techniques;Dry needling    PT Next Visit Plan work on bulging the pelvic floor in sidely and sitting; see how the stretches are    PT Home Exercise Plan Access Code: [email or web address]    Consulted and Agree with Plan of Care Patient           Patient will benefit from skilled therapeutic intervention in order to improve the following deficits and impairments:  Decreased coordination, Increased fascial restricitons, Pain, Decreased endurance, Decreased activity tolerance, Decreased strength  Visit Diagnosis: Cramp and spasm  Muscle weakness (generalized)  Fecal incontinence alternating with constipation     Problem List Patient Active Problem List   Diagnosis Date Noted  . Unspecified severe protein-calorie malnutrition ([place]) 10/13/2018  . Crohn's disease with rectal bleeding ([place]) 10/13/2018  . Iatrogenic adrenal insufficiency ([place]) 05/20/2018  . Constipation 10/14/2017  . Solitary rectal ulcer syndrome 10/14/2017  . Idiopathic scoliosis 12/13/2015  . Growth hormone deficiency ([place]) 12/01/2013  . Abnormal renal ultrasound 11/27/2013  . Lack of expected normal physiological development 06/18/2013  . ADHD (attention deficit hyperactivity disorder)   . Mood disorder  ([place])   . Short stature   . Psoriasis 02/27/2012  . 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 History of constipation     Earlie Counts, PT 09/30/19 8:45 AM    [facility] [address], [place], [place], [address] Phone: [phone]   Fax:  [phone]  Name: OZRO RUSSETT MRN: [id number] Date of Birth: 2000/06/08

## 2019-10-07 ENCOUNTER — Other Ambulatory Visit: Payer: Self-pay

## 2019-10-07 ENCOUNTER — Encounter: Payer: Self-pay | Admitting: Physical Therapy

## 2019-10-07 ENCOUNTER — Ambulatory Visit: Payer: Managed Care, Other (non HMO) | Attending: Pediatric Gastroenterology | Admitting: Physical Therapy

## 2019-10-07 DIAGNOSIS — R252 Cramp and spasm: Secondary | ICD-10-CM | POA: Insufficient documentation

## 2019-10-07 DIAGNOSIS — K5900 Constipation, unspecified: Secondary | ICD-10-CM | POA: Insufficient documentation

## 2019-10-07 DIAGNOSIS — M6281 Muscle weakness (generalized): Secondary | ICD-10-CM | POA: Diagnosis present

## 2019-10-07 DIAGNOSIS — R159 Full incontinence of feces: Secondary | ICD-10-CM | POA: Diagnosis present

## 2019-10-07 NOTE — Therapy (Signed)
[facility] [address] [place], [place], [address] Phone: [phone]   Fax:  [phone]  Physical Therapy Treatment  Patient Details  Name: Bradley [name] MRN: [id number] Date of Birth: [birth year] Referring Provider (PT): Dr. Kandis Ban   Encounter Date: 10/07/2019   PT End of Session - 10/07/19 0838    Visit Number 5    Date for PT Re-Evaluation 11/04/19    Authorization Type [employer]    PT Start Time 0800    PT Stop Time 0846    PT Time Calculation (min) 46 min    Activity Tolerance Patient tolerated treatment well;No increased pain    Behavior During Therapy WFL for tasks assessed/performed           Past Medical History:  Diagnosis Date   ADHD (attention deficit hyperactivity disorder)    Anxiety    Chronic constipation    Encopresis(307.7)    GSE (gluten-sensitive enteropathy)    Inflammatory bowel disease    Lactose intolerance    Mood disorder ([place])    Scoliosis    Short stature     Past Surgical History:  Procedure Laterality Date   ADENOIDECTOMY     COLONOSCOPY N/A 12/27/2015   Procedure: COLONOSCOPY;  Surgeon: Joycelyn Rua, MD;  Location: [place];  Service: Gastroenterology;  Laterality: N/A;   ESOPHAGOGASTRODUODENOSCOPY N/A 12/27/2015   Procedure: ESOPHAGOGASTRODUODENOSCOPY (EGD);  Surgeon: Joycelyn Rua, MD;  Location: [place];  Service: Gastroenterology;  Laterality: N/A;   ESOPHAGOGASTRODUODENOSCOPY N/A 08/03/2016   Procedure: ESOPHAGOGASTRODUODENOSCOPY (EGD);  Surgeon: Joycelyn Rua, MD;  Location: [place];  Service: Gastroenterology;  Laterality: N/A;   FLEXIBLE SIGMOIDOSCOPY  08/03/2016   Procedure: FLEXIBLE SIGMOIDOSCOPY;  Surgeon: Joycelyn Rua, MD;  Location: [place];  Service: Gastroenterology;;   tubes and adenoids      There were no vitals filed for this visit.   Subjective Assessment - 10/07/19 0803    Subjective I had a  tough week lsat week. I was in and out of the bathroom and increased pain. I have not made the right choices in food. this week is better.    Patient Stated Goals Not have to strain with BM; prevent fecal incontinence and toileting to interfere with his life; not worry about his toileting    Currently in Pain? Yes    Pain Score 1     Pain Location Rectum    Pain Orientation Mid    Pain Descriptors / Indicators Throbbing    Pain Type Chronic pain    Pain Onset More than a month ago    Pain Frequency Intermittent    Aggravating Factors  happens after using the bathroom, activity    Pain Relieving Factors Predisone, alleve, immodium    Multiple Pain Sites Yes    Pain Score 2    Pain Location Back    Pain Orientation Mid;Lower    Pain Descriptors / Indicators Aching    Pain Type Chronic pain    Pain Onset More than a month ago    Pain Frequency Intermittent    Aggravating Factors  slouching,    Pain Relieving Factors sit up straight              [facility] PT Assessment - 10/07/19 0001      Strength   Right Hip Extension 4/5    Right Hip Internal Rotation 4/5    Right Hip ABduction 4/5    Right Hip ADduction 4/5  Left Hip Extension 4/5    Left Hip Internal Rotation 4+/5    Left Hip ADduction 4/5                         [facility] Adult PT Treatment/Exercise - 10/07/19 0001      Lumbar Exercises: Stretches   Quadruped Mid Back Stretch 3 reps    Quadruped Mid Back Stretch Limitations childs pose and to the side with breath work into the spine      Lumbar Exercises: Quadruped   Madcat/Old Horse 15 reps    Madcat/Old Horse Limitations tactile cues to not move trunk forward and to elongate the spine with lumbar flexion    Other Quadruped Lumbar Exercises lay on ball with hips IR and rock back and forth to stretch the hip ER and elongate the left lumbar    Other Quadruped Lumbar Exercises thread the needle with right arm going under trunk      Modalities   Modalities  Moist Heat      Moist Heat Therapy   Number Minutes Moist Heat 10 Minutes    Moist Heat Location Lumbar Spine      Manual Therapy   Manual Therapy Soft tissue mobilization;Joint mobilization    Joint Mobilization right posterior rib cage to bring forward, Rotational mobilization to T4-L2 to the right     Soft tissue mobilization to left quadratus, right thoracic paraspinals                  PT Education - 10/07/19 0836    Education Details Access Code: [email or web address]    Person(s) Educated Patient    Methods Explanation;Demonstration;Verbal cues;Handout    Comprehension Verbalized understanding;Returned demonstration            PT Short Term Goals - 10/07/19 0841      PT SHORT TERM GOAL #1   Title independent with initial HEP    Time 4    Period Weeks    Status Achieved      PT SHORT TERM GOAL #2   Title rectal pain with bowel movement decreased >/= 25%    Baseline 65% better    Time 4    Period Weeks    Status Achieved      PT SHORT TERM GOAL #3   Title understand abdominal massage to move stool through the intestines and reduce straining with bowel movement    Time 4    Period Weeks    Status Achieved             PT Long Term Goals - 08/12/19 1424      PT LONG TERM GOAL #1   Title indpendent with HEP and understand how to progress himself    Time 12    Period Weeks    Status New    Target Date 11/04/19      PT LONG TERM GOAL #2   Title rectal pain with sitting and after a bowel movement decreased >/= 75%    Baseline --    Time 12    Period Weeks    Status New    Target Date 11/04/19      PT LONG TERM GOAL #3   Title straining to have a bowel movement decreased >/= 75% due to ability to relax the pelvic floor and use the lower abdominals to assist    Baseline --    Time 12    Period Weeks  Status New    Target Date 11/04/19      PT LONG TERM GOAL #4   Title fecal leakage decreased >/= 75% so he not having to wear a 1 or less depends per  day    Baseline --    Time 12    Period Weeks    Status New    Target Date 11/04/19      PT LONG TERM GOAL #5   Title stand for long period of time with rectal pain decreased >/= 505 and his legs feel weak minimally    Time 12    Period Weeks    Status New    Target Date 11/04/19                 Plan - 10/07/19 0815    Clinical Impression Statement Patient reports his rectal pain is 65% better and is takingl ess predisone. Patient has increased strength of hi pinternal rotators. Goal in therapy today was to elongate the spinal muscles to reduce the pain in the pelvic floor muscles and have them relax. He has a scoliotic curve with concave side on the left thoracolumbar area. Patient has a tight right rib cage that is rotated right. Patient has noticed when he eats a certain food it will increase his abdominal and rectal discomfort. Patient will benefit from skilled therapy to improve coordination of the pelvic floor msucles to reduce fecal leakage and pain.    Personal Factors and Comorbidities Comorbidity 3+    Comorbidities ADHD, mood disorder; inflammatory bowel disease    Examination-Activity Limitations Continence;Toileting;Stand;Sit    Examination-Participation Restrictions Community Activity;Driving    Stability/Clinical Decision Making Evolving/Moderate complexity    Rehab Potential Good    PT Frequency 1x / week    PT Duration 12 weeks    PT Treatment/Interventions Biofeedback;Cryotherapy;Electrical Stimulation;Moist Heat;Neuromuscular re-education;Therapeutic activities;Patient/family education;Manual techniques;Dry needling    PT Next Visit Plan work on bulging the pelvic floor in sidely and sitting; core strength in supine    PT Home Exercise Plan Access Code: [place] and Agree with Plan of Care Patient           Patient will benefit from skilled therapeutic intervention in order to improve the following deficits and impairments:  Decreased  coordination, Increased fascial restricitons, Pain, Decreased endurance, Decreased activity tolerance, Decreased strength  Visit Diagnosis: Cramp and spasm  Muscle weakness (generalized)  Fecal incontinence alternating with constipation     Problem List Patient Active Problem List   Diagnosis Date Noted   Unspecified severe protein-calorie malnutrition ([place]) 10/13/2018   Crohn's disease with rectal bleeding ([place]) 10/13/2018   Iatrogenic adrenal insufficiency ([place]) 05/20/2018   Constipation 10/14/2017   Solitary rectal ulcer syndrome 10/14/2017   Idiopathic scoliosis 12/13/2015   Growth hormone deficiency ([place]) 12/01/2013   Abnormal renal ultrasound 11/27/2013   Lack of expected normal physiological development 06/18/2013   ADHD (attention deficit hyperactivity disorder)    Mood disorder ([place])    Short stature    Psoriasis 02/27/2012   Lactose malabsorption 01/15/2011   History of constipation     Earlie Counts, PT 10/07/19 8:43 AM   [facility] [address] [place], [place], [address] Phone: [phone]   Fax:  [phone]  Name: DYON ROTERT MRN: [id number] Date of Birth: 08-Jan-2001

## 2019-10-07 NOTE — Patient Instructions (Signed)
Access Code: [email or web address] URL: [email or web address] Date: 10/07/2019 Prepared by: Earlie Counts  Exercises Seated Piriformis Stretch with Trunk Bend - 1 x daily - 7 x weekly - 1 sets - 2 reps - 30 sec hold Seated Hamstring Stretch - 1 x daily - 7 x weekly - 2 sets - 2 reps - 30 sec hold Standing ITB Stretch - 1 x daily - 7 x weekly - 1 sets - 2 reps - 15 sec hold Supine Abdominal Wall Massage - 1 x daily - 7 x weekly - 1 sets - 10 reps Child's Pose Stretch - 1 x daily - 7 x weekly - 3 sets - 1 reps - 30 sec hold Child's Pose with Sidebending - 1 x daily - 7 x weekly - 1 sets - 1 reps - 30 sec hold Cat-Camel - 1 x daily - 7 x weekly - 1 sets - 2 reps - 1 sec hold Quadriceps Stretch with Chair - 1 x daily - 7 x weekly - 1 sets - 2 reps - 15 sec hold Child's Pose with Thread the Needle - 1 x daily - 7 x weekly - 1 sets - 2 reps - 30 sec hold [facility] [address] [place], [place] [address] Phone # [phone] Fax [phone]

## 2019-10-14 ENCOUNTER — Other Ambulatory Visit: Payer: Self-pay

## 2019-10-14 ENCOUNTER — Ambulatory Visit: Payer: Managed Care, Other (non HMO) | Admitting: Physical Therapy

## 2019-10-14 ENCOUNTER — Encounter: Payer: Self-pay | Admitting: Physical Therapy

## 2019-10-14 DIAGNOSIS — M6281 Muscle weakness (generalized): Secondary | ICD-10-CM

## 2019-10-14 DIAGNOSIS — R159 Full incontinence of feces: Secondary | ICD-10-CM

## 2019-10-14 DIAGNOSIS — K5900 Constipation, unspecified: Secondary | ICD-10-CM

## 2019-10-14 DIAGNOSIS — R252 Cramp and spasm: Secondary | ICD-10-CM

## 2019-10-14 NOTE — Therapy (Signed)
[facility] [address], [place], [place], [address] Phone: [phone]   Fax:  [phone]  Physical Therapy Treatment  Patient Details  Name: Bradley [name] MRN: [id number] Date of Birth: 2000-06-13 Referring Provider (PT): Dr. Kandis Ban   Encounter Date: 10/14/2019   PT End of Session - 10/14/19 0839    Visit Number 6    Date for PT Re-Evaluation 11/04/19    Authorization Type [employer]    PT Start Time 0800    PT Stop Time 6948    PT Time Calculation (min) 38 min    Activity Tolerance Patient tolerated treatment well;No increased pain    Behavior During Therapy WFL for tasks assessed/performed           Past Medical History:  Diagnosis Date  . ADHD (attention deficit hyperactivity disorder)   . Anxiety   . Chronic constipation   . 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 GSE (gluten-sensitive enteropathy)   . Inflammatory bowel disease   . Lactose intolerance   . Mood disorder ([place])   . Scoliosis   . Short stature     Past Surgical History:  Procedure Laterality Date  . ADENOIDECTOMY    . COLONOSCOPY N/A 12/27/2015   Procedure: COLONOSCOPY;  Surgeon: Joycelyn Rua, MD;  Location: [place];  Service: Gastroenterology;  Laterality: N/A;  . ESOPHAGOGASTRODUODENOSCOPY N/A 12/27/2015   Procedure: ESOPHAGOGASTRODUODENOSCOPY (EGD);  Surgeon: Joycelyn Rua, MD;  Location: [place];  Service: Gastroenterology;  Laterality: N/A;  . ESOPHAGOGASTRODUODENOSCOPY N/A 08/03/2016   Procedure: ESOPHAGOGASTRODUODENOSCOPY (EGD);  Surgeon: Joycelyn Rua, MD;  Location: [place];  Service: Gastroenterology;  Laterality: N/A;  . FLEXIBLE SIGMOIDOSCOPY  08/03/2016   Procedure: FLEXIBLE SIGMOIDOSCOPY;  Surgeon: Joycelyn Rua, MD;  Location: [place];  Service: Gastroenterology;;  . tubes and adenoids      There were no vitals filed for this visit.   Subjective Assessment - 10/14/19 0804    Subjective I have a  new job so I am sitting more. 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 I have not used the bathroom today. the pain goes away now. After last visit the back pain went away.    Patient Stated Goals Not have to strain with BM; prevent fecal incontinence and toileting to interfere with his life; not worry about his toileting    Currently in Pain? 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 Kandis Ban      Strength   Right Hip Extension 4/5    Right Hip Internal Rotation 4/5    Right Hip ABduction 4/5    Right Hip ADduction 4/5    Left Hip Extension 4/5    Left Hip Internal Rotation 4+/5    Left Hip ADduction 4/5  [facility] Adult PT Treatment/Exercise - 10/14/19 0001      Lumbar Exercises: Supine   Bridge with clamshell 10 reps;1 second    Bridge with [employer] Limitations with red band    Isometric Hip Flexion 10 reps;5 seconds    Isometric Hip Flexion Limitations 10x each side with arms diagonal to work the obliques      Lumbar Exercises: Sidelying   Other Sidelying Lumbar Exercises hip adduction 10x each leg      Lumbar Exercises: Quadruped   Opposite Arm/Leg Raise Right arm/Left leg;Left arm/Right leg;10 reps      Manual Therapy   Manual Therapy Soft tissue mobilization;Myofascial release    Soft tissue mobilization I love you massage  and circular massage to promote peristalic motion of the intestines    Myofascial Release tissue rolling of the abdomen                    PT Education - 10/14/19 0827    Education Details Access Code: [email or web address]    Person(s) Educated Patient    Methods Explanation;Demonstration;Verbal cues;Handout    Comprehension Returned demonstration;Verbalized understanding            PT Short Term Goals - 10/07/19 0841      PT SHORT TERM GOAL #1   Title independent with initial HEP    Time 4    Period Weeks    Status Achieved      PT SHORT TERM GOAL #2   Title rectal pain with bowel movement decreased >/= 25%    Baseline 65% better    Time 4    Period Weeks    Status Achieved      PT SHORT TERM GOAL #3   Title understand abdominal massage to move stool through the intestines and reduce straining with bowel movement    Time 4    Period Weeks    Status Achieved             PT Long Term Goals - 10/14/19 2355      PT LONG TERM GOAL #1   Title indpendent with HEP and understand how to progress himself    Time 12    Period Weeks    Status On-going      PT LONG TERM GOAL #2   Title rectal pain with sitting and after a bowel movement decreased >/= 75%    Time 12    Period Weeks      PT LONG TERM GOAL #3   Title straining to have a bowel movement decreased >/= 75% due to ability to relax the pelvic floor and use the lower abdominals to assist    Time 12    Period Weeks    Status On-going      PT LONG TERM GOAL #4   Title fecal leakage decreased >/= 75% so he not having to wear a 1 or less depends per day    Time 12    Period Weeks    Status On-going      PT LONG TERM GOAL #5   Title stand for long period of time with rectal pain decreased >/= 50% and his legs feel weak minimally    Time 12    Period Weeks    Status On-going                 Plan - 10/14/19 7322    Clinical Impression Statement Patient is able to sit more during work so  he is having more intermittent pain in the rectal area. Patient did not have a bowel movement prior to therapy therefore did abdominal massage to  promote peristalic motion of the intestines. Patient is learning core strength exercises. Patient will benefit from skilled therapy to improve coordination of the pelvic floor muscles to reduce fecal leakage and pain.    Personal Factors and Comorbidities Comorbidity 3+    Comorbidities ADHD, mood disorder; inflammatory bowel disease    Examination-Activity Limitations Continence;Toileting;Stand;Sit    Examination-Participation Restrictions Community Activity;Driving    Stability/Clinical Decision Making Evolving/Moderate complexity    Rehab Potential Good    PT Frequency 1x / week    PT Duration 12 weeks    PT Treatment/Interventions Biofeedback;Cryotherapy;Electrical Stimulation;Moist Heat;Neuromuscular re-education;Therapeutic activities;Patient/family education;Manual techniques;Dry needling    PT Next Visit Plan work on bulging the pelvic floor in sidely and sitting; core strength in supine    PT Home Exercise Plan Access Code: [email or web address]    Consulted and Agree with Plan of Care Patient           Patient will benefit from skilled therapeutic intervention in order to improve the following deficits and impairments:  Decreased coordination, Increased fascial restricitons, Pain, Decreased endurance, Decreased activity tolerance, Decreased strength  Visit Diagnosis: Cramp and spasm  Muscle weakness (generalized)  Fecal incontinence alternating with constipation     Problem List Patient Active Problem List   Diagnosis Date Noted  . Unspecified severe protein-calorie malnutrition ([place]) 10/13/2018  . Crohn's disease with rectal bleeding ([place]) 10/13/2018  . Iatrogenic adrenal insufficiency ([place]) 05/20/2018  . Constipation 10/14/2017  . Solitary rectal ulcer syndrome 10/14/2017  . Idiopathic scoliosis 12/13/2015  . Growth hormone deficiency ([place]) 12/01/2013  . Abnormal renal ultrasound 11/27/2013  . Lack of expected normal physiological development 06/18/2013  . ADHD (attention  deficit hyperactivity disorder)   . Mood disorder ([place])   . Short stature   . Psoriasis 02/27/2012  . 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 History of constipation     Earlie Counts, PT 10/14/19 8:43 AM   [facility] [address] [place], [place], [address] Phone: [phone]   Fax:  [phone]  Name: Bradley [name] MRN: [id number] Date of Birth: July 12, 2000

## 2019-10-14 NOTE — Patient Instructions (Signed)
Access Code: [email or web address] URL: [email or web address] Date: 10/14/2019 Prepared by: Earlie Counts  Exercises Supine Abdominal Wall Massage - 1 x daily - 7 x weekly - 1 sets - 10 reps Seated Piriformis Stretch with Trunk Bend - 1 x daily - 7 x weekly - 1 sets - 2 reps - 30 sec hold Seated Hamstring Stretch - 1 x daily - 7 x weekly - 2 sets - 2 reps - 30 sec hold Standing ITB Stretch - 1 x daily - 7 x weekly - 1 sets - 2 reps - 15 sec hold Child's Pose Stretch - 1 x daily - 7 x weekly - 3 sets - 1 reps - 30 sec hold Child's Pose with Sidebending - 1 x daily - 7 x weekly - 1 sets - 1 reps - 30 sec hold Cat-Camel - 1 x daily - 7 x weekly - 1 sets - 2 reps - 1 sec hold Quadriceps Stretch with Chair - 1 x daily - 7 x weekly - 1 sets - 2 reps - 15 sec hold Child's Pose with Thread the Needle - 1 x daily - 7 x weekly - 1 sets - 2 reps - 30 sec hold Hooklying Isometric Hip Flexion with Opposite Arm - 1 x daily - 7 x weekly - 1 sets - 10 reps - 5 sec hold Sidelying Hip Adduction - 1 x daily - 7 x weekly - 1 sets - 10 reps - 1 sec hold Bridge with Hip Abduction and Resistance - 1 x daily - 7 x weekly - 3 sets - 10 reps Bird Dog - 1 x daily - 7 x weekly - 2 sets - 10 reps [facility] [address] [place], [place] [address] Phone # [phone] Fax [phone]

## 2019-10-21 ENCOUNTER — Encounter: Payer: Self-pay | Admitting: Physical Therapy

## 2019-10-21 ENCOUNTER — Ambulatory Visit: Payer: Managed Care, Other (non HMO) | Admitting: Physical Therapy

## 2019-10-21 ENCOUNTER — Other Ambulatory Visit: Payer: Self-pay

## 2019-10-21 DIAGNOSIS — R252 Cramp and spasm: Secondary | ICD-10-CM | POA: Diagnosis not present

## 2019-10-21 DIAGNOSIS — R159 Full incontinence of feces: Secondary | ICD-10-CM

## 2019-10-21 DIAGNOSIS — K5900 Constipation, unspecified: Secondary | ICD-10-CM

## 2019-10-21 DIAGNOSIS — M6281 Muscle weakness (generalized): Secondary | ICD-10-CM

## 2019-10-21 NOTE — Patient Instructions (Signed)
Access Code: [email or web address] URL: [email or web address] Date: 10/21/2019 Prepared by: Earlie Counts  Exercises Supine Abdominal Wall Massage - 1 x daily - 7 x weekly - 1 sets - 10 reps Seated Piriformis Stretch with Trunk Bend - 1 x daily - 7 x weekly - 1 sets - 2 reps - 30 sec hold Seated Hamstring Stretch - 1 x daily - 7 x weekly - 2 sets - 2 reps - 30 sec hold Standing ITB Stretch - 1 x daily - 7 x weekly - 1 sets - 2 reps - 15 sec hold Child's Pose Stretch - 1 x daily - 7 x weekly - 3 sets - 1 reps - 30 sec hold Child's Pose with Sidebending - 1 x daily - 7 x weekly - 1 sets - 1 reps - 30 sec hold Quadriceps Stretch with Chair - 1 x daily - 7 x weekly - 1 sets - 2 reps - 15 sec hold Hooklying Isometric Hip Flexion with Opposite Arm - 1 x daily - 7 x weekly - 1 sets - 10 reps - 5 sec hold Bridge with Hip Abduction and Resistance - 1 x daily - 7 x weekly - 3 sets - 10 reps Bird Dog - 1 x daily - 7 x weekly - 2 sets - 10 reps [facility] [address] [place], [place] [address] Phone # [phone] Fax [phone]

## 2019-10-21 NOTE — Therapy (Signed)
[facility] [address] [place], [place], [address] Phone: [phone]   Fax:  [phone]  Physical Therapy Treatment  Patient Details  Name: Bradley [name] MRN: [id number] Date of Birth: 2000/12/07 Referring Provider (PT): Dr. Kandis Ban   Encounter Date: 10/21/2019   PT End of Session - 10/21/19 0842    Visit Number 7    Date for PT Re-Evaluation 11/04/19    Authorization Type [employer]    PT Start Time 0800    PT Stop Time 3094    PT Time Calculation (min) 38 min    Activity Tolerance Patient tolerated treatment well;No increased pain    Behavior During Therapy WFL for tasks assessed/performed           Past Medical History:  Diagnosis Date   ADHD (attention deficit hyperactivity disorder)    Anxiety    Chronic constipation    Encopresis(307.7)    GSE (gluten-sensitive enteropathy)    Inflammatory bowel disease    Lactose intolerance    Mood disorder ([place])    Scoliosis    Short stature     Past Surgical History:  Procedure Laterality Date   ADENOIDECTOMY     COLONOSCOPY N/A 12/27/2015   Procedure: COLONOSCOPY;  Surgeon: Joycelyn Rua, MD;  Location: [place];  Service: Gastroenterology;  Laterality: N/A;   ESOPHAGOGASTRODUODENOSCOPY N/A 12/27/2015   Procedure: ESOPHAGOGASTRODUODENOSCOPY (EGD);  Surgeon: Joycelyn Rua, MD;  Location: [place];  Service: Gastroenterology;  Laterality: N/A;   ESOPHAGOGASTRODUODENOSCOPY N/A 08/03/2016   Procedure: ESOPHAGOGASTRODUODENOSCOPY (EGD);  Surgeon: Joycelyn Rua, MD;  Location: [place];  Service: Gastroenterology;  Laterality: N/A;   FLEXIBLE SIGMOIDOSCOPY  08/03/2016   Procedure: FLEXIBLE SIGMOIDOSCOPY;  Surgeon: Joycelyn Rua, MD;  Location: [place];  Service: Gastroenterology;;   tubes and adenoids      There were no vitals filed for this visit.   Subjective Assessment - 10/21/19 0803    Subjective I did the  exercises 1 time. I am on the commode 30-40 minutes.    Patient Stated Goals Not have to strain with BM; prevent fecal incontinence and toileting to interfere with his life; not worry about his toileting    Currently in Pain? 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 Kandis Ban                         [facility] Adult PT Treatment/Exercise - 10/21/19 0001      Self-Care   Self-Care Other Self-Care Comments    Other Self-Care Comments  discussed with patient on not sitting more than 15 minutes on the commode and to leave the commode after a bowel movement      Lumbar Exercises: Stretches   Active Hamstring Stretch Right;Left;1 rep;30 seconds    Active Hamstring Stretch  Limitations supine with strap    Hip Flexor Stretch Right;Left;1 rep;30 seconds    Hip Flexor Stretch Limitations sidely    ITB Stretch Right;Left;1 rep;30 seconds    ITB Stretch Limitations supine with strap    Piriformis Stretch Right;Left;1 rep;30 seconds    Piriformis Stretch Limitations pigeon pose      Lumbar Exercises: Sidelying   Other Sidelying Lumbar Exercises lay on side in lunge position with top leg push back on foam roll and bottom leg foward wtih foot on wall hold 5 sec 5x each side                  PT Education - 10/21/19 0831    Education Details Access Code: [email or web address]; not sitting on the commode for more than 15 minutes    Person(s) Educated Patient    Methods Explanation;Demonstration;Verbal cues;Handout     Comprehension Returned demonstration;Verbalized understanding            PT Short Term Goals - 10/07/19 0841      PT SHORT TERM GOAL #1   Title independent with initial HEP    Time 4    Period Weeks    Status Achieved      PT SHORT TERM GOAL #2   Title rectal pain with bowel movement decreased >/= 25%    Baseline 65% better    Time 4    Period Weeks    Status Achieved      PT SHORT TERM GOAL #3   Title understand abdominal massage to move stool through the intestines and reduce straining with bowel movement    Time 4    Period Weeks    Status Achieved             PT Long Term Goals - 10/21/19 0804      PT LONG TERM GOAL #1   Title indpendent with HEP and understand how to progress himself    Time 12    Period Weeks    Status On-going      PT LONG TERM GOAL #2   Title rectal pain with sitting and after a bowel movement decreased >/= 75%    Baseline no change with pain but the recovery time is less    Time 12    Period Weeks    Status On-going      PT LONG TERM GOAL #3   Title straining to have a bowel movement decreased >/= 75% due to ability to relax the pelvic floor and use the lower abdominals to assist    Baseline straining 75% less    Time 12    Period Weeks    Status On-going      PT LONG TERM GOAL #4   Title fecal leakage decreased >/= 75% so he not having to wear a 1 or less depends per day    Baseline 50% better    Time 12    Period Weeks    Status On-going      PT LONG TERM GOAL #5   Title stand for long period of time with rectal pain decreased >/= 50% and his legs feel weak minimally    Baseline stand with 70% less pain    Time 12    Period Weeks    Status On-going                 Plan - 10/21/19 0827    Clinical Impression Statement Patient reports he is straining 75% less.  The fecal leakage is 50% better. He has 70% less pain rectally on standing. Patient has tight hip flexor on the right and needs elongation of the muscles.  Patient HEP was adjusted to a small number to make it easier for her and is 15 minutes long. Patient was instructed to not sit on the commode longer than 15 minutes for a bowel movement. Patient will benefit from skilled therapy to reduce pain and improve pelvic floor coordination.    Personal Factors and Comorbidities Comorbidity 3+    Comorbidities ADHD, mood disorder; inflammatory bowel disease    Examination-Activity Limitations Continence;Toileting;Stand;Sit    Examination-Participation Restrictions Community Activity;Driving    Stability/Clinical Decision Making Evolving/Moderate complexity    Rehab Potential Good    PT Frequency 1x / week    PT Duration 12 weeks    PT Treatment/Interventions Biofeedback;Cryotherapy;Electrical Stimulation;Moist Heat;Neuromuscular re-education;Therapeutic activities;Patient/family education;Manual techniques;Dry needling    PT Next Visit Plan write renewal; work on pelvic floor EMG with the car mode;    PT Home Exercise Plan Access Code: [email or web address]    Consulted and Agree with Plan of Care Patient           Patient will benefit from skilled therapeutic intervention in order to improve the following deficits and impairments:  Decreased coordination, Increased fascial restricitons, Pain, Decreased endurance, Decreased activity tolerance, Decreased strength  Visit Diagnosis: Cramp and spasm  Muscle weakness (generalized)  Fecal incontinence alternating with constipation     Problem List Patient Active Problem List   Diagnosis Date Noted   Unspecified severe protein-calorie malnutrition ([place]) 10/13/2018   Crohn's disease with rectal bleeding ([place]) 10/13/2018   Iatrogenic adrenal insufficiency ([place]) 05/20/2018   Constipation 10/14/2017   Solitary rectal ulcer syndrome 10/14/2017   Idiopathic scoliosis 12/13/2015   Growth hormone deficiency ([place]) 12/01/2013   Abnormal renal ultrasound 11/27/2013   Lack of expected normal physiological  development 06/18/2013   ADHD (attention deficit hyperactivity disorder)    Mood disorder ([place])    Short stature    Psoriasis 02/27/2012   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, [place], [place], [address] Phone: [phone]   Fax:  [phone]  Name: SAHAJ BONA MRN: [id number] Date of Birth: [birth year]

## 2019-10-28 ENCOUNTER — Other Ambulatory Visit: Payer: Self-pay

## 2019-10-28 ENCOUNTER — Encounter: Payer: Self-pay | Admitting: Physical Therapy

## 2019-10-28 ENCOUNTER — Ambulatory Visit: Payer: Managed Care, Other (non HMO) | Admitting: Physical Therapy

## 2019-10-28 DIAGNOSIS — R159 Full incontinence of feces: Secondary | ICD-10-CM

## 2019-10-28 DIAGNOSIS — R252 Cramp and spasm: Secondary | ICD-10-CM | POA: Diagnosis not present

## 2019-10-28 DIAGNOSIS — M6281 Muscle weakness (generalized): Secondary | ICD-10-CM

## 2019-10-28 DIAGNOSIS — K5900 Constipation, unspecified: Secondary | ICD-10-CM

## 2019-10-28 NOTE — Therapy (Signed)
[facility] [address] [place], [place], [address] Phone: [phone]   Fax:  [phone]  Physical Therapy Treatment  Patient Details  Name: Bradley [name] MRN: [id number] Date of Birth: [birth year] Referring Provider (PT): Dr. Kandis Ban   Encounter Date: 10/28/2019   PT End of Session - 10/28/19 0830    Visit Number 8    Date for PT Re-Evaluation 01/27/20    Authorization Type [employer]    PT Start Time 0800    PT Stop Time 0840    PT Time Calculation (min) 40 min    Activity Tolerance Patient tolerated treatment well;No increased pain    Behavior During Therapy WFL for tasks assessed/performed           Past Medical History:  Diagnosis Date  . ADHD (attention deficit hyperactivity disorder)   . Anxiety   . Chronic constipation   . 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 GSE (gluten-sensitive enteropathy)   . Inflammatory bowel disease   . Lactose intolerance   . Mood disorder ([place])   . Scoliosis   . Short stature     Past Surgical History:  Procedure Laterality Date  . ADENOIDECTOMY    . COLONOSCOPY N/A 12/27/2015   Procedure: COLONOSCOPY;  Surgeon: Joycelyn Rua, MD;  Location: [place];  Service: Gastroenterology;  Laterality: N/A;  . ESOPHAGOGASTRODUODENOSCOPY N/A 12/27/2015   Procedure: ESOPHAGOGASTRODUODENOSCOPY (EGD);  Surgeon: Joycelyn Rua, MD;  Location: [place];  Service: Gastroenterology;  Laterality: N/A;  . ESOPHAGOGASTRODUODENOSCOPY N/A 08/03/2016   Procedure: ESOPHAGOGASTRODUODENOSCOPY (EGD);  Surgeon: Joycelyn Rua, MD;  Location: [place];  Service: Gastroenterology;  Laterality: N/A;  . FLEXIBLE SIGMOIDOSCOPY  08/03/2016   Procedure: FLEXIBLE SIGMOIDOSCOPY;  Surgeon: Joycelyn Rua, MD;  Location: [place];  Service: Gastroenterology;;  . tubes and adenoids      There were no vitals filed for this visit.   Subjective Assessment - 10/28/19 0803    Subjective I used the  bathroom and was uncomfortable and sat on the cushion. I did not lean and recovered quicker. This was yesterday. I have been on the toilet for shorter time. The soreness has increased. I have forgot to exercise my muscles.    Patient Stated Goals Not have to strain with BM; prevent fecal incontinence and toileting to interfere with his life; not worry about his toileting    Currently in Pain? 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 Kandis Ban    Prior Therapy yes      Precautions   Precautions None      Restrictions   Weight Bearing Restrictions No      Home Environment   Living Environment Private residence      Cognition   Overall Cognitive Status Within Functional Limits for tasks assessed      Posture/Postural Control   Posture/Postural Control Postural  limitations    Posture Comments large rib hump on the right thoracic       ROM / Strength   AROM / PROM / Strength AROM;PROM;Strength      Strength   Right Hip Extension 5/5    Right Hip Internal Rotation 5/5    Right Hip ABduction 4/5    Right Hip ADduction 5/5    Left Hip Extension 5/5    Left Hip Internal Rotation 5/5    Left Hip ADduction 4/5                      Pelvic Floor Special Questions - 10/28/19 0001    Biofeedback sidely resting tone 2 uv, doing the hill with max at 15 uv working on control, Patient was having spikes so worked on relaxation of the pelvic floor; worked with eyes closed with relaxation breathing    Biofeedback sensor type  Surface   rectal                      PT Short Term Goals - 10/07/19 0841      PT SHORT TERM GOAL #1   Title independent with initial HEP    Time 4    Period Weeks    Status Achieved      PT SHORT TERM GOAL #2   Title rectal pain with bowel movement decreased >/= 25%    Baseline 65% better    Time 4    Period Weeks    Status Achieved      PT SHORT TERM GOAL #3   Title understand abdominal massage to move stool through the intestines and reduce straining with bowel movement    Time 4    Period Weeks    Status Achieved             PT Long Term Goals - 10/28/19 8110      PT LONG TERM GOAL #1   Title indpendent with HEP and understand how to progress himself    Time 12    Period Weeks    Status On-going      PT LONG TERM GOAL #2   Title rectal pain with sitting and after a bowel movement decreased >/= 75%    Baseline no change with pain but the recovery time is less    Time 12    Period Weeks    Status On-going      PT LONG TERM GOAL #3   Title straining to have a bowel movement decreased >/= 75% due to ability to relax the pelvic floor and use the lower abdominals to assist    Baseline straining 75% less    Time 12    Period Weeks    Status Achieved      PT LONG TERM GOAL #4   Title fecal leakage decreased >/= 75% so he not having to wear a 1 or less depends per day    Baseline 50% better    Time 12    Period Weeks    Status On-going      PT LONG TERM GOAL #5   Title stand for long period of time with rectal pain decreased >/= 50% and his legs feel weak minimally    Baseline stand with 70% less pain    Time 12    Period Weeks    Status On-going  Plan - 10/28/19 0831    Clinical Impression Statement This session patient was able to relax his rectal spasm after contraction using the pelvic floor EMG. Patient has increased in bilateral hip strength. His fecal leakage is 50% better. Patient is able to stand with rectal  pain 50% better. Patient is straining 75% less when on the commode. Patient recovery time with rectal pain after a bowel movement is 3 hours less. Resting tone of the anus is 2 uv. After he does a session of contractions of the anus he starts to have spasms of the anal sphincter. He is able to calm the spasms down with eyes closed and diaphragmatic breathing. Patient is learning how to sit with equal weight on bilateral ischial tuberosity. Patient will benefit from skilled therapy to reduce pain and improve pelvic floor coordination.    Personal Factors and Comorbidities Comorbidity 3+    Comorbidities ADHD, mood disorder; inflammatory bowel disease    Examination-Activity Limitations Continence;Toileting;Stand;Sit    Examination-Participation Restrictions Community Activity;Driving    Stability/Clinical Decision Making Evolving/Moderate complexity    Rehab Potential Good    PT Frequency 1x / week    PT Treatment/Interventions Biofeedback;Cryotherapy;Electrical Stimulation;Moist Heat;Neuromuscular re-education;Therapeutic activities;Patient/family education;Manual techniques;Dry needling    PT Next Visit Plan work on pelvic floor EMG with the hill program and relaxation, abdominal massage    PT Home Exercise Plan Access Code: [email or web address]    Recommended Other Services sent MD renewal    Consulted and Agree with Plan of Care Patient           Patient will benefit from skilled therapeutic intervention in order to improve the following deficits and impairments:  Decreased coordination, Increased fascial restricitons, Pain, Decreased endurance, Decreased activity tolerance, Decreased strength  Visit Diagnosis: Cramp and spasm - Plan: PT plan of care cert/re-cert  Muscle weakness (generalized) - Plan: PT plan of care cert/re-cert  Fecal incontinence alternating with constipation - Plan: PT plan of care cert/re-cert     Problem List Patient Active Problem List   Diagnosis Date Noted  .  Unspecified severe protein-calorie malnutrition ([place]) 10/13/2018  . Crohn's disease with rectal bleeding ([place]) 10/13/2018  . Iatrogenic adrenal insufficiency ([place]) 05/20/2018  . Constipation 10/14/2017  . Solitary rectal ulcer syndrome 10/14/2017  . Idiopathic scoliosis 12/13/2015  . Growth hormone deficiency ([place]) 12/01/2013  . Abnormal renal ultrasound 11/27/2013  . Lack of expected normal physiological development 06/18/2013  . ADHD (attention deficit hyperactivity disorder)   . Mood disorder ([place])   . Short stature   . Psoriasis 02/27/2012  . 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 History of constipation     Earlie Counts, PT 10/28/19 8:43 AM   [facility] [address], [place], [place], [address] Phone: [phone]   Fax:  [phone]  Name: Bradley [name] MRN: [id number] Date of Birth: 07-30-2000

## 2019-11-04 ENCOUNTER — Encounter: Payer: Self-pay | Admitting: Physical Therapy

## 2019-11-04 ENCOUNTER — Ambulatory Visit: Payer: Managed Care, Other (non HMO) | Attending: Pediatric Gastroenterology | Admitting: Physical Therapy

## 2019-11-04 ENCOUNTER — Other Ambulatory Visit: Payer: Self-pay

## 2019-11-04 DIAGNOSIS — R159 Full incontinence of feces: Secondary | ICD-10-CM

## 2019-11-04 DIAGNOSIS — M6281 Muscle weakness (generalized): Secondary | ICD-10-CM | POA: Insufficient documentation

## 2019-11-04 DIAGNOSIS — K5900 Constipation, unspecified: Secondary | ICD-10-CM | POA: Diagnosis present

## 2019-11-04 DIAGNOSIS — R252 Cramp and spasm: Secondary | ICD-10-CM | POA: Insufficient documentation

## 2019-11-04 NOTE — Therapy (Signed)
[facility] [address], [place], [place], [address] Phone: [phone]   Fax:  [phone]  Physical Therapy Treatment  Patient Details  Name: Bradley [name] MRN: [id number] Date of Birth: Jul 27, 2000 Referring Provider (PT): Dr. Kandis Ban   Encounter Date: 11/04/2019   PT End of Session - 11/04/19 0850    Visit Number 9    Date for PT Re-Evaluation 01/27/20    Authorization Type [employer]    PT Start Time [phone]   had to go to the bathroom   PT Stop Time 0921   had to go home to the bathroom   PT Time Calculation (min) 31 min    Activity Tolerance Patient tolerated treatment well;No increased pain    Behavior During Therapy WFL for tasks assessed/performed           Past Medical History:  Diagnosis Date   ADHD (attention deficit hyperactivity disorder)    Anxiety    Chronic constipation    Encopresis(307.7)    GSE (gluten-sensitive enteropathy)    Inflammatory bowel disease    Lactose intolerance    Mood disorder ([place])    Scoliosis    Short stature     Past Surgical History:  Procedure Laterality Date   ADENOIDECTOMY     COLONOSCOPY N/A 12/27/2015   Procedure: COLONOSCOPY;  Surgeon: Joycelyn Rua, MD;  Location: [place];  Service: Gastroenterology;  Laterality: N/A;   ESOPHAGOGASTRODUODENOSCOPY N/A 12/27/2015   Procedure: ESOPHAGOGASTRODUODENOSCOPY (EGD);  Surgeon: Joycelyn Rua, MD;  Location: [place];  Service: Gastroenterology;  Laterality: N/A;   ESOPHAGOGASTRODUODENOSCOPY N/A 08/03/2016   Procedure: ESOPHAGOGASTRODUODENOSCOPY (EGD);  Surgeon: Joycelyn Rua, MD;  Location: [place];  Service: Gastroenterology;  Laterality: N/A;   FLEXIBLE SIGMOIDOSCOPY  08/03/2016   Procedure: FLEXIBLE SIGMOIDOSCOPY;  Surgeon: Joycelyn Rua, MD;  Location: [place];  Service: Gastroenterology;;   tubes and adenoids      There were no vitals filed for this visit.    Subjective Assessment - 11/04/19 0852    Subjective I have been exercising. The exercises are helping me go to the bathroom. Still straining to have a bowel movement. Able to sit more often on both buttocks. I am able to catch myself when I am slouching.    Patient Stated Goals Not have to strain with BM; prevent fecal incontinence and toileting to interfere with his life; not worry about his toileting    Currently in Pain? Yes    Pain Score 2     Pain Location Rectum    Pain Orientation Mid    Pain Descriptors / Indicators Aching;Discomfort    Pain Type Chronic pain    Pain Onset More than a month ago    Pain Frequency Intermittent    Aggravating Factors  sit on commode, sitting    Pain Relieving Factors not sit    Multiple Pain Sites Yes    Pain Score 3    Pain Location Back    Pain Orientation Mid    Pain Descriptors / Indicators Discomfort    Pain Type Chronic pain    Pain Onset More than a month ago    Pain Frequency Intermittent    Aggravating Factors  slouching    Pain Relieving Factors sit up straight                          Pelvic Floor Special Questions - 11/04/19 0001    Biofeedback resting  tone is 1 uv, laying in sidely, going up ramp with control; sitting with ramp program; sitting step program    Biofeedback sensor type Surface   rectal            [facility] Adult PT Treatment/Exercise - 11/04/19 0001      Self-Care   Self-Care Other Self-Care Comments    Other Self-Care Comments  discussed with patient on his toileting, if he can not strain, how long he sits on the commode, sitting on reach buttocks                    PT Short Term Goals - 10/07/19 0841      PT SHORT TERM GOAL #1   Title independent with initial HEP    Time 4    Period Weeks    Status Achieved      PT SHORT TERM GOAL #2   Title rectal pain with bowel movement decreased >/= 25%    Baseline 65% better    Time 4    Period Weeks    Status Achieved      PT SHORT  TERM GOAL #3   Title understand abdominal massage to move stool through the intestines and reduce straining with bowel movement    Time 4    Period Weeks    Status Achieved             PT Long Term Goals - 11/04/19 0908      PT LONG TERM GOAL #1   Title indpendent with HEP and understand how to progress himself    Time 12    Period Weeks    Status On-going      PT LONG TERM GOAL #2   Title rectal pain with sitting and after a bowel movement decreased >/= 75%    Baseline no change with pain but the recovery time is less    Time 12    Period Weeks    Status On-going      PT LONG TERM GOAL #3   Title straining to have a bowel movement decreased >/= 75% due to ability to relax the pelvic floor and use the lower abdominals to assist    Baseline straining 75% less    Time 12    Period Weeks    Status Achieved      PT LONG TERM GOAL #4   Title fecal leakage decreased >/= 75% so he not having to wear a 1 or less depends per day    Baseline 60% better    Time 12    Period Weeks    Status On-going      PT LONG TERM GOAL #5   Title stand for long period of time with rectal pain decreased >/= 50% and his legs feel weak minimally    Baseline stand with 60% less pain    Time 12    Period Weeks    Status On-going                 Plan - 11/04/19 0900    Clinical Impression Statement Patient resting tone in sidely and sitting is 1.5 uv. He is able to follow the ramp program with good control in sidely and sitting. Patient reports he is 20% better from last visit and 605 better since the intial evaluation. Patient has not had any spasms when resting his pelvic floor. As he was doing his exericses the urge to get to the bathroom was  reduced. Patient will benefit from skilled therapy to reduce pain and improve pelvic floor coordination.    Comorbidities ADHD, mood disorder; inflammatory bowel disease    Examination-Activity Limitations Continence;Toileting;Stand;Sit     Examination-Participation Restrictions Community Activity;Driving    Stability/Clinical Decision Making Evolving/Moderate complexity    Rehab Potential Good    PT Frequency 1x / week    PT Duration 12 weeks    PT Treatment/Interventions Biofeedback;Cryotherapy;Electrical Stimulation;Moist Heat;Neuromuscular re-education;Therapeutic activities;Patient/family education;Manual techniques;Dry needling    PT Next Visit Plan work on pelvic floor EMG with the hill program and relaxation, abdominal massage    PT Home Exercise Plan Access Code: [email or web address]    Recommended Other Services MD signed all notes    Consulted and Agree with Plan of Care Patient           Patient will benefit from skilled therapeutic intervention in order to improve the following deficits and impairments:  Decreased coordination, Increased fascial restricitons, Pain, Decreased endurance, Decreased activity tolerance, Decreased strength  Visit Diagnosis: Cramp and spasm  Muscle weakness (generalized)  Fecal incontinence alternating with constipation     Problem List Patient Active Problem List   Diagnosis Date Noted   Unspecified severe protein-calorie malnutrition ([place]) 10/13/2018   Crohn's disease with rectal bleeding ([place]) 10/13/2018   Iatrogenic adrenal insufficiency ([place]) 05/20/2018   Constipation 10/14/2017   Solitary rectal ulcer syndrome 10/14/2017   Idiopathic scoliosis 12/13/2015   Growth hormone deficiency ([place]) 12/01/2013   Abnormal renal ultrasound 11/27/2013   Lack of expected normal physiological development 06/18/2013   ADHD (attention deficit hyperactivity disorder)    Mood disorder ([place])    Short stature    Psoriasis 02/27/2012   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, [place], [place], [address] Phone: [phone]   Fax:   [phone]  Name: EVERET FLAGG MRN: [id number] Date of Birth: [birth year]

## 2019-11-14 ENCOUNTER — Encounter (INDEPENDENT_AMBULATORY_CARE_PROVIDER_SITE_OTHER): Payer: Self-pay

## 2019-11-16 ENCOUNTER — Encounter (INDEPENDENT_AMBULATORY_CARE_PROVIDER_SITE_OTHER): Payer: Self-pay

## 2019-11-18 ENCOUNTER — Other Ambulatory Visit: Payer: Self-pay

## 2019-11-18 ENCOUNTER — Ambulatory Visit: Payer: Managed Care, Other (non HMO) | Admitting: Physical Therapy

## 2019-11-18 ENCOUNTER — Encounter: Payer: Self-pay | Admitting: Physical Therapy

## 2019-11-18 DIAGNOSIS — M6281 Muscle weakness (generalized): Secondary | ICD-10-CM

## 2019-11-18 DIAGNOSIS — R252 Cramp and spasm: Secondary | ICD-10-CM | POA: Diagnosis not present

## 2019-11-18 DIAGNOSIS — R159 Full incontinence of feces: Secondary | ICD-10-CM

## 2019-11-18 DIAGNOSIS — K5900 Constipation, unspecified: Secondary | ICD-10-CM

## 2019-11-18 NOTE — Therapy (Signed)
[facility] [address] [place], [place], [address] Phone: [phone]   Fax:  [phone]  Physical Therapy Treatment  Patient Details  Name: Bradley [name] MRN: [id number] Date of Birth: Feb 23, 2001 Referring Provider (PT): Dr. Kandis Ban   Encounter Date: 11/18/2019   PT End of Session - 11/18/19 1131    Visit Number 10    Date for PT Re-Evaluation 01/27/20    Authorization Type [employer]    PT Start Time 1100    PT Stop Time 1138    PT Time Calculation (min) 38 min    Activity Tolerance Patient tolerated treatment well;No increased pain    Behavior During Therapy WFL for tasks assessed/performed           Past Medical History:  Diagnosis Date  . ADHD (attention deficit hyperactivity disorder)   . Anxiety   . Chronic constipation   . 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 GSE (gluten-sensitive enteropathy)   . Inflammatory bowel disease   . Lactose intolerance   . Mood disorder ([place])   . Scoliosis   . Short stature     Past Surgical History:  Procedure Laterality Date  . ADENOIDECTOMY    . COLONOSCOPY N/A 12/27/2015   Procedure: COLONOSCOPY;  Surgeon: Joycelyn Rua, MD;  Location: [place];  Service: Gastroenterology;  Laterality: N/A;  . ESOPHAGOGASTRODUODENOSCOPY N/A 12/27/2015   Procedure: ESOPHAGOGASTRODUODENOSCOPY (EGD);  Surgeon: Joycelyn Rua, MD;  Location: [place];  Service: Gastroenterology;  Laterality: N/A;  . ESOPHAGOGASTRODUODENOSCOPY N/A 08/03/2016   Procedure: ESOPHAGOGASTRODUODENOSCOPY (EGD);  Surgeon: Joycelyn Rua, MD;  Location: [place];  Service: Gastroenterology;  Laterality: N/A;  . FLEXIBLE SIGMOIDOSCOPY  08/03/2016   Procedure: FLEXIBLE SIGMOIDOSCOPY;  Surgeon: Joycelyn Rua, MD;  Location: [place];  Service: Gastroenterology;;  . tubes and adenoids      There were no vitals filed for this visit.   Subjective Assessment - 11/18/19 1104    Subjective I have  not been consistent with my exercises. Recovery time is better. I have less stress at work and last day of summer. 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 Does not sit on toilet waiting for the bowel movement to come    Patient Stated Goals Not have to strain with BM; prevent fecal incontinence and toileting to interfere with his life; not worry about his toileting    Currently in Pain? Yes    Pain Score 3     Pain Location Rectum    Pain Orientation Mid    Pain Descriptors / Indicators Aching;Discomfort    Pain Type Chronic pain    Pain Onset More than a month ago    Pain Frequency Intermittent    Aggravating Factors  sitting on commode, sitting    Pain Relieving Factors not sitting    Multiple Pain Sites No                             [facility] Adult PT Treatment/Exercise - 11/18/19 0001      Therapeutic Activites    Therapeutic Activities Other Therapeutic Activities    Other Therapeutic Activities practice breathing with increased abdominal distention to expand the pelvic floor while sitting on the mat with knees elevated      Lumbar Exercises: Stretches   Active Hamstring Stretch Right;Left;1 rep;30 seconds    Active Hamstring Stretch Limitations supine with strap    Piriformis Stretch Right;Left;1 rep;30 seconds  Piriformis Stretch Limitations supine      Lumbar Exercises: Supine   Bridge with clamshell 10 reps;1 second   2 sets   Isometric Hip Flexion 10 reps;5 seconds    Isometric Hip Flexion Limitations diagonal       Lumbar Exercises: Quadruped   Opposite Arm/Leg Raise Right arm/Left leg;Left arm/Right leg;10 reps;1 second    Opposite Arm/Leg Raise Limitations TActile cue to contract the abdominals                  PT Education - 11/18/19 1126    Education Details Access Code: [email or web address]    Person(s) Educated Patient    Methods Explanation;Demonstration;Verbal cues;Handout    Comprehension Returned demonstration;Verbalized understanding             PT Short Term Goals - 10/07/19 0841      PT SHORT TERM GOAL #1   Title independent with initial HEP    Time 4    Period Weeks    Status Achieved      PT SHORT TERM GOAL #2   Title rectal pain with bowel movement decreased >/= 25%    Baseline 65% better    Time 4    Period Weeks    Status Achieved      PT SHORT TERM GOAL #3   Title understand abdominal massage to move stool through the intestines and reduce straining with bowel movement    Time 4    Period Weeks    Status Achieved             PT Long Term Goals - 11/18/19 1136      PT LONG TERM GOAL #1   Title indpendent with HEP and understand how to progress himself    Time 12    Period Weeks    Status On-going      PT LONG TERM GOAL #2   Title rectal pain with sitting and after a bowel movement decreased >/= 75%    Baseline no change with pain but the recovery time is less    Time 12    Period Weeks    Status On-going      PT LONG TERM GOAL #3   Title straining to have a bowel movement decreased >/= 75% due to ability to relax the pelvic floor and use the lower abdominals to assist    Baseline straining 75% less    Time 12    Period Weeks    Status Achieved      PT LONG TERM GOAL #4   Title fecal leakage decreased >/= 75% so he not having to wear a 1 or less depends per day    Baseline 60% better    Time 12    Period Weeks      PT LONG TERM GOAL #5   Title stand for long period of time with rectal pain decreased >/= 50% and his legs feel weak minimally    Baseline stand with 60% less pain    Time 12    Period Weeks    Status On-going                 Plan - 11/18/19 1109    Clinical Impression Statement Patient will tighten the hamstrings when contracting the pelvic floor. Patient was fatiqued after he did his HEP with the therapist. Patient was able to do the quadruped lift opposite arm and leg with increased trunk control. Patient needed more verbal cues to expand  his abdomen whlie  sitting and feet on the squatty potty. Patient is not on the commode as long now. Patient is not having as much pain with toileting. Patient is able to relax the pelvic floor with greater ease. Patient will benefit from skilled from skilled therapy to reduce pain and improve pelvic floor coordination.    Personal Factors and Comorbidities Comorbidity 3+    Comorbidities ADHD, mood disorder; inflammatory bowel disease    Examination-Activity Limitations Continence;Toileting;Stand;Sit    Examination-Participation Restrictions Community Activity;Driving    Stability/Clinical Decision Making Evolving/Moderate complexity    Rehab Potential Good    PT Frequency 1x / week    PT Duration 12 weeks    PT Treatment/Interventions Biofeedback;Cryotherapy;Electrical Stimulation;Moist Heat;Neuromuscular re-education;Therapeutic activities;Patient/family education;Manual techniques;Dry needling    PT Next Visit Plan biofeedback with the squatty potty for pelvic floor contraction; work on relaxation of the hamstring    PT Home Exercise Plan Access Code: [place] and Agree with Plan of Care Patient           Patient will benefit from skilled therapeutic intervention in order to improve the following deficits and impairments:  Decreased coordination, Increased fascial restricitons, Pain, Decreased endurance, Decreased activity tolerance, Decreased strength  Visit Diagnosis: Cramp and spasm  Muscle weakness (generalized)  Fecal incontinence alternating with constipation     Problem List Patient Active Problem List   Diagnosis Date Noted  . Unspecified severe protein-calorie malnutrition ([place]) 10/13/2018  . Crohn's disease with rectal bleeding ([place]) 10/13/2018  . Iatrogenic adrenal insufficiency ([place]) 05/20/2018  . Constipation 10/14/2017  . Solitary rectal ulcer syndrome 10/14/2017  . Idiopathic scoliosis 12/13/2015  . Growth hormone deficiency ([place]) 12/01/2013  . Abnormal renal  ultrasound 11/27/2013  . Lack of expected normal physiological development 06/18/2013  . ADHD (attention deficit hyperactivity disorder)   . Mood disorder ([place])   . Short stature   . Psoriasis 02/27/2012  . 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 History of constipation     Earlie Counts, PT 11/18/19 11:40 AM   [facility] [address] [place], [place], [address] Phone: [phone]   Fax:  [phone]  Name: FULLER MAKIN MRN: [id number] Date of Birth: 07-27-2000

## 2019-11-18 NOTE — Patient Instructions (Signed)
Access Code: [email or web address] URL: [email or web address] Date: 11/18/2019 Prepared by: Earlie Counts  Exercises Supine Abdominal Wall Massage - 1 x daily - 7 x weekly - 1 sets - 10 reps Seated Piriformis Stretch with Trunk Bend - 1 x daily - 7 x weekly - 1 sets - 2 reps - 30 sec hold Seated Hamstring Stretch - 1 x daily - 7 x weekly - 2 sets - 2 reps - 30 sec hold Standing ITB Stretch - 1 x daily - 7 x weekly - 1 sets - 2 reps - 15 sec hold Child's Pose Stretch - 1 x daily - 7 x weekly - 3 sets - 1 reps - 30 sec hold Quadriceps Stretch with Chair - 1 x daily - 7 x weekly - 1 sets - 2 reps - 15 sec hold Hooklying Isometric Hip Flexion with Opposite Arm - 1 x daily - 7 x weekly - 1 sets - 10 reps - 5 sec hold Bridge with Hip Abduction and Resistance - 1 x daily - 7 x weekly - 3 sets - 10 reps Bird Dog - 1 x daily - 7 x weekly - 2 sets - 10 reps [facility] [address] [place], [place] [address] Phone # [phone] Fax [phone]

## 2019-11-26 ENCOUNTER — Ambulatory Visit: Payer: Managed Care, Other (non HMO) | Admitting: Physical Therapy

## 2019-12-10 ENCOUNTER — Encounter: Payer: Self-pay | Admitting: Physical Therapy

## 2019-12-10 ENCOUNTER — Ambulatory Visit: Payer: Managed Care, Other (non HMO) | Attending: Pediatric Gastroenterology | Admitting: Physical Therapy

## 2019-12-10 ENCOUNTER — Other Ambulatory Visit: Payer: Self-pay

## 2019-12-10 DIAGNOSIS — R252 Cramp and spasm: Secondary | ICD-10-CM | POA: Insufficient documentation

## 2019-12-10 DIAGNOSIS — R159 Full incontinence of feces: Secondary | ICD-10-CM

## 2019-12-10 DIAGNOSIS — M6281 Muscle weakness (generalized): Secondary | ICD-10-CM

## 2019-12-10 DIAGNOSIS — K5900 Constipation, unspecified: Secondary | ICD-10-CM | POA: Insufficient documentation

## 2019-12-10 NOTE — Therapy (Signed)
[facility] [address] [place], [place], [address] Phone: [phone]   Fax:  [phone]  Physical Therapy Treatment  Patient Details  Name: Bradley [name] MRN: [id number] Date of Birth: September 14, 2000 Referring Provider (PT): Dr. Kandis Ban   Encounter Date: 12/10/2019   PT End of Session - 12/10/19 1013    Visit Number 11    Date for PT Re-Evaluation 01/27/20    Authorization Type [employer]    PT Start Time 0930    PT Stop Time 1008    PT Time Calculation (min) 38 min    Activity Tolerance Patient tolerated treatment well;No increased pain    Behavior During Therapy WFL for tasks assessed/performed           Past Medical History:  Diagnosis Date  . ADHD (attention deficit hyperactivity disorder)   . Anxiety   . Chronic constipation   . 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 GSE (gluten-sensitive enteropathy)   . Inflammatory bowel disease   . Lactose intolerance   . Mood disorder ([place])   . Scoliosis   . Short stature     Past Surgical History:  Procedure Laterality Date  . ADENOIDECTOMY    . COLONOSCOPY N/A 12/27/2015   Procedure: COLONOSCOPY;  Surgeon: Joycelyn Rua, MD;  Location: [place];  Service: Gastroenterology;  Laterality: N/A;  . ESOPHAGOGASTRODUODENOSCOPY N/A 12/27/2015   Procedure: ESOPHAGOGASTRODUODENOSCOPY (EGD);  Surgeon: Joycelyn Rua, MD;  Location: [place];  Service: Gastroenterology;  Laterality: N/A;  . ESOPHAGOGASTRODUODENOSCOPY N/A 08/03/2016   Procedure: ESOPHAGOGASTRODUODENOSCOPY (EGD);  Surgeon: Joycelyn Rua, MD;  Location: [place];  Service: Gastroenterology;  Laterality: N/A;  . FLEXIBLE SIGMOIDOSCOPY  08/03/2016   Procedure: FLEXIBLE SIGMOIDOSCOPY;  Surgeon: Joycelyn Rua, MD;  Location: [place];  Service: Gastroenterology;;  . tubes and adenoids      There were no vitals filed for this visit.   Subjective Assessment - 12/10/19 0933    Subjective I have  been standing alot more due to my job. When I sit, I am not able to find a comfortable position due to pain. Sometimes has small droplets for a bowel movement. In the evening I do not use the bathroom except for last night. Patient has had leakage that was sticky. REcovery time is worse. I have not been doing my exercises.    Patient Stated Goals Not have to strain with BM; prevent fecal incontinence and toileting to interfere with his life; not worry about his toileting    Currently in Pain? Yes    Pain Score 6     Pain Location Rectum    Pain Orientation Mid    Pain Descriptors / Indicators Aching;Discomfort    Pain Onset More than a month ago    Pain Frequency Intermittent    Aggravating Factors  sitting on commode, sitting    Pain Relieving Factors not sitting    Multiple Pain Sites No                             [facility] Adult PT Treatment/Exercise - 12/10/19 0001      Self-Care   Self-Care Other Self-Care Comments    Other Self-Care Comments  instructed patient to massage the anal area in sidely and her returned demonstration. Discussed with patient on his tolieting and the time is on the commode and ways to shorted the time      Neuro Re-ed    Neuro Re-ed  Details  Patient did a 10 minute meditation to reduce his pain and anxiety. Pain level was 4/10 after.                   PT Education - 12/10/19 1011    Education Details guided meditation; ow to massage the pelvic floor externally    Person(s) Educated Patient    Methods Explanation;Demonstration;Handout    Comprehension Returned demonstration;Verbalized understanding            PT Short Term Goals - 10/07/19 0841      PT SHORT TERM GOAL #1   Title independent with initial HEP    Time 4    Period Weeks    Status Achieved      PT SHORT TERM GOAL #2   Title rectal pain with bowel movement decreased >/= 25%    Baseline 65% better    Time 4    Period Weeks    Status Achieved      PT SHORT  TERM GOAL #3   Title understand abdominal massage to move stool through the intestines and reduce straining with bowel movement    Time 4    Period Weeks    Status Achieved             PT Long Term Goals - 12/10/19 1141      PT LONG TERM GOAL #1   Title indpendent with HEP and understand how to progress himself    Time 12    Period Weeks    Status On-going      PT LONG TERM GOAL #2   Baseline no change with pain but the recovery time is less    Time 12    Period Weeks    Status On-going      PT LONG TERM GOAL #3   Title straining to have a bowel movement decreased >/= 75% due to ability to relax the pelvic floor and use the lower abdominals to assist    Time 12    Period Weeks    Status Achieved      PT LONG TERM GOAL #4   Title fecal leakage decreased >/= 75% so he not having to wear a 1 or less depends per day    Baseline 60% better    Time 12    Period Weeks      PT LONG TERM GOAL #5   Title stand for long period of time with rectal pain decreased >/= 50% and his legs feel weak minimally    Baseline stand with 60% less pain    Time 12    Period Weeks    Status On-going                 Plan - 12/10/19 1014    Clinical Impression Statement Patient has not been doing his exercises at home. Patient is having increased pain in the rectum to 8/10 due to sitting on the commocde for 30-45 minutes and haveing a bowel movement the whole time. Patient feels better when he is doing his exercises consistently. Patient did the pelvic floor meditation program and pain decreased to 4/10. Patient has learned how to perform massage to the rectal area to reduce his pain. Patient has pain in his left foot when the right pelvic floor muscles are massaged. Patient will benefit from skilled therapy to reduce pain and improve pelvic floor coordination.    Personal Factors and Comorbidities Comorbidity 3+    Comorbidities ADHD,  mood disorder; inflammatory bowel disease     Examination-Activity Limitations Continence;Toileting;Stand;Sit    Examination-Participation Restrictions Community Activity;Driving    Stability/Clinical Decision Making Evolving/Moderate complexity    Rehab Potential Good    PT Frequency 1x / week    PT Duration 12 weeks    PT Treatment/Interventions Biofeedback;Cryotherapy;Electrical Stimulation;Moist Heat;Neuromuscular re-education;Therapeutic activities;Patient/family education;Manual techniques;Dry needling    PT Next Visit Plan see how the HEP is doing, soft tissue work to the levator ani externally, see how long he is toileting    PT Home Exercise Plan Access Code: [email or web address]    Consulted and Agree with Plan of Care Patient           Patient will benefit from skilled therapeutic intervention in order to improve the following deficits and impairments:  Decreased coordination, Increased fascial restricitons, Pain, Decreased endurance, Decreased activity tolerance, Decreased strength  Visit Diagnosis: Cramp and spasm  Muscle weakness (generalized)  Fecal incontinence alternating with constipation     Problem List Patient Active Problem List   Diagnosis Date Noted  . Unspecified severe protein-calorie malnutrition ([place]) 10/13/2018  . Crohn's disease with rectal bleeding ([place]) 10/13/2018  . Iatrogenic adrenal insufficiency ([place]) 05/20/2018  . Constipation 10/14/2017  . Solitary rectal ulcer syndrome 10/14/2017  . Idiopathic scoliosis 12/13/2015  . Growth hormone deficiency ([place]) 12/01/2013  . Abnormal renal ultrasound 11/27/2013  . Lack of expected normal physiological development 06/18/2013  . ADHD (attention deficit hyperactivity disorder)   . Mood disorder ([place])   . Short stature   . Psoriasis 02/27/2012  . 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 History of constipation     Earlie Counts, PT 12/10/19 11:42 AM   [facility] [address] [place],  [place], [address] Phone: [phone]   Fax:  [phone]  Name: AMADOU KATZENSTEIN MRN: [id number] Date of Birth: [birth year]

## 2019-12-10 NOTE — Patient Instructions (Addendum)
Guided Meditation for Pelvic Floor Relaxation  [employer]     Daily massage around the rectum for 1-2 minutes on each side.  [facility] [address], [place], [place] [address] Phone # [phone] Fax [phone]

## 2019-12-23 ENCOUNTER — Ambulatory Visit: Payer: Managed Care, Other (non HMO) | Admitting: Physical Therapy

## 2019-12-23 ENCOUNTER — Other Ambulatory Visit: Payer: Self-pay

## 2019-12-23 ENCOUNTER — Encounter: Payer: Self-pay | Admitting: Physical Therapy

## 2019-12-23 DIAGNOSIS — R252 Cramp and spasm: Secondary | ICD-10-CM | POA: Diagnosis not present

## 2019-12-23 DIAGNOSIS — K5900 Constipation, unspecified: Secondary | ICD-10-CM

## 2019-12-23 DIAGNOSIS — R159 Full incontinence of feces: Secondary | ICD-10-CM

## 2019-12-23 DIAGNOSIS — M6281 Muscle weakness (generalized): Secondary | ICD-10-CM

## 2019-12-23 NOTE — Patient Instructions (Signed)
Access Code: [email or web address] URL: [email or web address] Date: 12/23/2019 Prepared by: Earlie Counts  Exercises Supine Abdominal Wall Massage - 1 x daily - 7 x weekly - 1 sets - 10 reps Seated Piriformis Stretch with Trunk Bend - 1 x daily - 7 x weekly - 1 sets - 2 reps - 30 sec hold Seated Hamstring Stretch - 1 x daily - 7 x weekly - 2 sets - 2 reps - 30 sec hold Standing ITB Stretch - 1 x daily - 7 x weekly - 1 sets - 2 reps - 15 sec hold Child's Pose Stretch - 1 x daily - 7 x weekly - 3 sets - 1 reps - 30 sec hold Hooklying Isometric Hip Flexion with Opposite Arm - 1 x daily - 7 x weekly - 1 sets - 10 reps - 5 sec hold Bridge with Hip Abduction and Resistance - 1 x daily - 7 x weekly - 3 sets - 10 reps Bird Dog - 1 x daily - 7 x weekly - 2 sets - 10 reps Seated Slump Neural Tensioner - 1 x daily - 7 x weekly - 1 sets - 10 reps Supine Hamstring Stretch with Strap - 1 x daily - 7 x weekly - 1 sets - 2 reps - 30 sec hold Seated Hamstring Stretch - 1 x daily - 7 x weekly - 1 sets - 2 reps - 30 sec hold Standing Forward Trunk Flexion - 1 x daily - 7 x weekly - 1 sets - 2 reps - 30 sec hold Quadriceps Stretch with Chair - 1 x daily - 7 x weekly - 1 sets - 2 reps - 15 sec hold renal artery stenosis [facility] [address], [place], [place] [address] Phone # [phone] Fax [phone]

## 2019-12-23 NOTE — Therapy (Signed)
[facility] [address], [place], [place], [address] Phone: [phone]   Fax:  [phone]  Physical Therapy Treatment  Patient Details  Name: Bradley [name] MRN: [id number] Date of Birth: [birth year] Referring Provider (PT): Dr. Kandis Ban   Encounter Date: 12/23/2019   PT End of Session - 12/23/19 0833    Visit Number 12    Date for PT Re-Evaluation 01/27/20    Authorization Type [employer]    PT Start Time 0800    PT Stop Time 0840    PT Time Calculation (min) 40 min    Activity Tolerance Patient tolerated treatment well;No increased pain    Behavior During Therapy WFL for tasks assessed/performed           Past Medical History:  Diagnosis Date  . ADHD (attention deficit hyperactivity disorder)   . Anxiety   . Chronic constipation   . 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 GSE (gluten-sensitive enteropathy)   . Inflammatory bowel disease   . Lactose intolerance   . Mood disorder ([place])   . Scoliosis   . Short stature     Past Surgical History:  Procedure Laterality Date  . ADENOIDECTOMY    . COLONOSCOPY N/A 12/27/2015   Procedure: COLONOSCOPY;  Surgeon: Joycelyn Rua, MD;  Location: [place];  Service: Gastroenterology;  Laterality: N/A;  . ESOPHAGOGASTRODUODENOSCOPY N/A 12/27/2015   Procedure: ESOPHAGOGASTRODUODENOSCOPY (EGD);  Surgeon: Joycelyn Rua, MD;  Location: [place];  Service: Gastroenterology;  Laterality: N/A;  . ESOPHAGOGASTRODUODENOSCOPY N/A 08/03/2016   Procedure: ESOPHAGOGASTRODUODENOSCOPY (EGD);  Surgeon: Joycelyn Rua, MD;  Location: [place];  Service: Gastroenterology;  Laterality: N/A;  . FLEXIBLE SIGMOIDOSCOPY  08/03/2016   Procedure: FLEXIBLE SIGMOIDOSCOPY;  Surgeon: Joycelyn Rua, MD;  Location: [place];  Service: Gastroenterology;;  . tubes and adenoids      There were no vitals filed for this visit.   Subjective Assessment - 12/23/19 0803    Subjective I was  away. BM were normal. I was using the portable squatty potty. I had foot pain one day. The rectal pain has not been as bad. 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 I have not used the bathroom today.    Patient Stated Goals Not have to strain with BM; prevent fecal incontinence and toileting to interfere with his life; not worry about his toileting    Currently in Pain? No/denies                             [facility] Adult PT Treatment/Exercise - 12/23/19 0001      Lumbar Exercises: Stretches   Active Hamstring Stretch Right;Left;30 seconds;5 reps    Active Hamstring Stretch Limitations supine with strap, in standing    Hip Flexor Stretch Right;Left;1 rep;30 seconds    Hip Flexor Stretch Limitations standing      Manual Therapy   Manual Therapy Soft tissue mobilization    Soft tissue mobilization using the addaday to the hamstring bilaterally, using the Iastim device on the levator ani bil. in sidely to elongate the tissue; circular massage to the abdomen to promote preistalic motion of the intestinces                  PT Education - 12/23/19 0844    Education Details Access Code: [email or web address]    Person(s) Educated Patient    Methods Explanation;Demonstration;Handout;Verbal cues    Comprehension Returned demonstration;Verbalized understanding  PT Short Term Goals - 10/07/19 0841      PT SHORT TERM GOAL #1   Title independent with initial HEP    Time 4    Period Weeks    Status Achieved      PT SHORT TERM GOAL #2   Title rectal pain with bowel movement decreased >/= 25%    Baseline 65% better    Time 4    Period Weeks    Status Achieved      PT SHORT TERM GOAL #3   Title understand abdominal massage to move stool through the intestines and reduce straining with bowel movement    Time 4    Period Weeks    Status Achieved             PT Long Term Goals - 12/23/19 0847      PT LONG TERM GOAL #1   Title indpendent with HEP and  understand how to progress himself    Time 12    Period Weeks    Status On-going      PT LONG TERM GOAL #2   Baseline no change with pain but the recovery time is less    Time 12    Period Weeks    Status On-going      PT LONG TERM GOAL #3   Title straining to have a bowel movement decreased >/= 75% due to ability to relax the pelvic floor and use the lower abdominals to assist    Baseline straining 75% less    Time 12    Period Weeks    Status Achieved      PT LONG TERM GOAL #4   Title fecal leakage decreased >/= 75% so he not having to wear a 1 or less depends per day    Baseline 60% better    Time 12    Period Weeks    Status On-going      PT LONG TERM GOAL #5   Title stand for long period of time with rectal pain decreased >/= 50% and his legs feel weak minimally    Baseline stand with 60% less pain    Time 12    Period Weeks    Status On-going                 Plan - 12/23/19 0845    Clinical Impression Statement Patient had a good week. He had a bowel movement daily and limited his time in the commode. He is having left foot pain intermittently while toileting and it may be due to the trigger point in the levator ani. Educated patient on nueral tension stretch of the left leg. Patient has tight hamstrings that pull the pelvis backward whick could also cause the pain in the left foot. Patient is doing better with management of his pain. Patient will benefit from skilled therapy to reduc epain and improve pelvic floor coordination.    Personal Factors and Comorbidities Comorbidity 3+    Comorbidities ADHD, mood disorder; inflammatory bowel disease    Examination-Activity Limitations Continence;Toileting;Stand;Sit    Examination-Participation Restrictions Community Activity;Driving    Stability/Clinical Decision Making Evolving/Moderate complexity    Rehab Potential Good    PT Frequency 1x / week    PT Duration 12 weeks    PT Treatment/Interventions  Biofeedback;Cryotherapy;Electrical Stimulation;Moist Heat;Neuromuscular re-education;Therapeutic activities;Patient/family education;Manual techniques;Dry needling    PT Next Visit Plan see how the HEP is doing, soft tissue work to the levator ani externally, see  how long he is toileting    PT Home Exercise Plan Access Code: [email or web address]    Consulted and Agree with Plan of Care Patient           Patient will benefit from skilled therapeutic intervention in order to improve the following deficits and impairments:  Decreased coordination, Increased fascial restricitons, Pain, Decreased endurance, Decreased activity tolerance, Decreased strength  Visit Diagnosis: Cramp and spasm  Muscle weakness (generalized)  Fecal incontinence alternating with constipation     Problem List Patient Active Problem List   Diagnosis Date Noted  . Unspecified severe protein-calorie malnutrition ([place]) 10/13/2018  . Crohn's disease with rectal bleeding ([place]) 10/13/2018  . Iatrogenic adrenal insufficiency ([place]) 05/20/2018  . Constipation 10/14/2017  . Solitary rectal ulcer syndrome 10/14/2017  . Idiopathic scoliosis 12/13/2015  . Growth hormone deficiency ([place]) 12/01/2013  . Abnormal renal ultrasound 11/27/2013  . Lack of expected normal physiological development 06/18/2013  . ADHD (attention deficit hyperactivity disorder)   . Mood disorder ([place])   . Short stature   . Psoriasis 02/27/2012  . 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 History of constipation     Earlie Counts, PT 12/23/19 8:48 AM   [facility] [address], [place], [place], [address] Phone: [phone]   Fax:  [phone]  Name: Bradley [name] MRN: [id number] Date of Birth: [birth year]

## 2019-12-26 ENCOUNTER — Encounter (INDEPENDENT_AMBULATORY_CARE_PROVIDER_SITE_OTHER): Payer: Self-pay

## 2020-01-07 ENCOUNTER — Other Ambulatory Visit: Payer: Self-pay

## 2020-01-07 ENCOUNTER — Ambulatory Visit: Payer: Managed Care, Other (non HMO) | Attending: Pediatric Gastroenterology | Admitting: Physical Therapy

## 2020-01-07 ENCOUNTER — Encounter: Payer: Self-pay | Admitting: Physical Therapy

## 2020-01-07 DIAGNOSIS — K5900 Constipation, unspecified: Secondary | ICD-10-CM | POA: Insufficient documentation

## 2020-01-07 DIAGNOSIS — R159 Full incontinence of feces: Secondary | ICD-10-CM

## 2020-01-07 DIAGNOSIS — R252 Cramp and spasm: Secondary | ICD-10-CM | POA: Insufficient documentation

## 2020-01-07 DIAGNOSIS — M6281 Muscle weakness (generalized): Secondary | ICD-10-CM | POA: Insufficient documentation

## 2020-01-07 NOTE — Therapy (Signed)
[facility] [address], [place], [place], [address] Phone: [phone]   Fax:  [phone]  Physical Therapy Treatment  Patient Details  Name: Bradley [name] MRN: [id number] Date of Birth: 01/04/2001 Referring Provider (PT): Dr. 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)    GSE (gluten-sensitive enteropathy)    Inflammatory bowel disease    Lactose intolerance    Mood disorder ([place])    Scoliosis    Short stature     Past Surgical History:  Procedure Laterality Date   ADENOIDECTOMY     COLONOSCOPY N/A 12/27/2015   Procedure: COLONOSCOPY;  Surgeon: Joycelyn Rua, MD;  Location: [facility];  Service: Gastroenterology;  Laterality: N/A;   ESOPHAGOGASTRODUODENOSCOPY N/A 12/27/2015   Procedure: ESOPHAGOGASTRODUODENOSCOPY (EGD);  Surgeon: Joycelyn Rua, MD;  Location: [facility];  Service: Gastroenterology;  Laterality: N/A;   ESOPHAGOGASTRODUODENOSCOPY N/A 08/03/2016   Procedure: ESOPHAGOGASTRODUODENOSCOPY (EGD);  Surgeon: Joycelyn Rua, MD;  Location: [facility];  Service: Gastroenterology;  Laterality: N/A;   FLEXIBLE SIGMOIDOSCOPY  08/03/2016   Procedure: FLEXIBLE SIGMOIDOSCOPY;  Surgeon: Joycelyn Rua, MD;  Location: [facility];  Service: Gastroenterology;;   tubes and adenoids      There were no vitals filed for this visit.   Subjective Assessment - 01/07/20 0804    Subjective I am  doing well. Hard to do the stretches due to my hip hurting. REctal pain is 75% better.    Patient Stated Goals Not have to strain with BM; prevent fecal incontinence and toileting to interfere with his life; not worry about his toileting    Currently in Pain? 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 Kandis Ban    Prior Therapy yes      Precautions   Precautions None      Restrictions   Weight Bearing Restrictions No      Home Environment   Living Environment Private residence      Cognition   Overall Cognitive Status Within Functional Limits for tasks assessed      Posture/Postural Control   Posture/Postural Control Postural limitations    Posture Comments large rib hump on the right thoracic       ROM /  Strength   AROM / PROM / Strength AROM;PROM;Strength      PROM   Overall PROM Comments passive right hip flexion is painful at endrange      Strength   Right Hip Flexion 4/5   with pain   Right Hip ABduction 3+/5   with pain   Left Hip ADduction 4/5      Palpation   Palpation comment tenderness located on right ASIS and hip flexor tendon                      Pelvic Floor Special Questions - 01/07/20 0001    Fecal incontinence Yes   2 depends            [facility] Adult PT Treatment/Exercise - 01/07/20 0001      Self-Care   Self-Care Other Self-Care Comments    Other Self-Care Comments  discussed with patient on abdominal massage to assist with constipation and it helped him  have a bowel movememt      Exercises   Exercises Other Exercises    Other Exercises  discussed with patient on his HEP and what exercises he should resume. Patient verbally understands his program and feels confident with them      Modalities   Modalities Cryotherapy      Moist Heat Therapy   Number Minutes Moist Heat 10 Minutes    Moist Heat Location Other (comment)   right ASIS     Manual Therapy   Manual Therapy Soft tissue mobilization;Joint mobilization;Muscle Energy Technique    Joint Mobilization PA and rotational mobilization to L3-L5 in prone; anterior glide of right hip grade 3 in prone    Soft tissue mobilization gentle soft tissue work to the right ASIS on the medial side of the abdomen and rectus femoris attachment    Muscle Energy Technique to correct right ilium                   PT Education - 01/07/20 0840    Education Details Access Code: [email or web address]    Person(s) Educated Patient    Methods Explanation;Demonstration;Verbal cues;Handout    Comprehension Returned demonstration;Verbalized understanding            PT Short Term Goals - 10/07/19 0841      PT SHORT TERM GOAL #1   Title independent with initial HEP    Time 4    Period Weeks    Status Achieved      PT SHORT TERM GOAL #2   Title rectal pain with bowel movement decreased >/= 25%    Baseline 65% better    Time 4    Period Weeks    Status Achieved      PT SHORT TERM GOAL #3   Title understand abdominal massage to move stool through the intestines and reduce straining with bowel movement    Time 4    Period Weeks    Status Achieved             PT Long Term Goals - 01/07/20 0539      PT LONG TERM GOAL #1   Title indpendent with HEP and understand how to progress himself    Time 12    Period Weeks    Status On-going      PT LONG TERM GOAL #2   Title rectal pain with sitting and after a bowel movement decreased >/= 75%    Baseline 75% better today but changes each session  Time 12    Period Weeks    Status On-going      PT LONG TERM GOAL #3   Title straining to have a bowel movement decreased >/= 75% due to ability to relax the pelvic floor and use the lower abdominals to assist    Baseline today is 65% better and changes each session    Time 12    Period Weeks    Status Not Met      PT LONG TERM GOAL #4   Title fecal leakage decreased >/= 75% so he not having to wear a 1 or less depends per day    Baseline 75% better, 2 depends per day    Time 12    Period Weeks    Status On-going      PT LONG TERM GOAL #5   Title stand for long period of time with rectal pain decreased >/= 50% and his legs feel weak minimally    Baseline 40% better and changes each session    Time 12    Period Weeks    Status On-going                 Plan - 01/07/20 3976    Clinical Impression Statement Patient reports his rectal pain overall since initial evaluation is 75% better and with standing is 60% better. Patient reports he strains 65% with bowel movements. Patient fecal leakage is 75% better and wears 2 depends per day. Patient is using his abdominal massage to assist with constipation. Patient has decreased strength with bilateral hip abduction. His right ilium is anteriorly rotated and tendern at the Rectus femoris origin. Patient is doing his HEP. Patient is working on pelvic floor control with endurance and strength. Patient will benefit from skilled therapy to reduce pain and improve pelvic floor coordination.    Personal Factors and Comorbidities Comorbidity 3+    Comorbidities ADHD, mood disorder; inflammatory bowel disease    Examination-Activity Limitations Continence;Toileting;Stand;Sit    Examination-Participation Restrictions Community Activity;Driving    Stability/Clinical Decision Making Evolving/Moderate complexity    Rehab Potential Good    PT Frequency 1x / week    PT Duration 8 weeks    PT Treatment/Interventions  Biofeedback;Cryotherapy;Electrical Stimulation;Moist Heat;Neuromuscular re-education;Therapeutic activities;Patient/family education;Manual techniques;Dry needling    PT Next Visit Plan see how the HEP is doing, soft tissue work to the levator ani externally, see how long he is toileting    PT Home Exercise Plan Access Code: [email or web address]    Consulted and Agree with Plan of Care Patient           Patient will benefit from skilled therapeutic intervention in order to improve the following deficits and impairments:  Decreased coordination, Increased fascial restricitons, Pain, Decreased endurance, Decreased activity tolerance, Decreased strength  Visit Diagnosis: Cramp and spasm - Plan: PT plan of care cert/re-cert  Muscle weakness (generalized) - Plan: PT plan of care cert/re-cert  Fecal incontinence alternating with constipation - Plan: PT plan of care cert/re-cert     Problem List Patient Active Problem List   Diagnosis Date Noted   Unspecified severe protein-calorie malnutrition ([place]) 10/13/2018   Crohn's disease with rectal bleeding ([place]) 10/13/2018   Iatrogenic adrenal insufficiency ([place]) 05/20/2018   Constipation 10/14/2017   Solitary rectal ulcer syndrome 10/14/2017   Idiopathic scoliosis 12/13/2015   Growth hormone deficiency ([place]) 12/01/2013   Abnormal renal ultrasound 11/27/2013   Lack of expected normal physiological development 06/18/2013   ADHD (attention deficit hyperactivity disorder)  Mood disorder ([place])    Short stature    Psoriasis 02/27/2012   Lactose malabsorption 01/15/2011   History of constipation     Earlie Counts, PT 01/07/20 8:46 AM   [facility] [address] [place], [place], [address] Phone: [phone]   Fax:  [phone]  Name: Bradley [name] MRN: [id number] Date of Birth: 2001-02-04

## 2020-01-07 NOTE — Patient Instructions (Signed)
Access Code: [email or web address] URL: [email or web address] Date: 01/07/2020 Prepared by: Earlie Counts  Exercises Supine Abdominal Wall Massage - 1 x daily - 7 x weekly - 1 sets - 10 reps Seated Piriformis Stretch with Trunk Bend - 1 x daily - 7 x weekly - 1 sets - 2 reps - 30 sec hold Seated Hamstring Stretch - 1 x daily - 7 x weekly - 2 sets - 2 reps - 30 sec hold Standing ITB Stretch - 1 x daily - 7 x weekly - 1 sets - 2 reps - 15 sec hold Child's Pose Stretch - 1 x daily - 7 x weekly - 3 sets - 1 reps - 30 sec hold Hooklying Isometric Hip Flexion with Opposite Arm - 1 x daily - 7 x weekly - 1 sets - 10 reps - 5 sec hold Bridge with Hip Abduction and Resistance - 1 x daily - 7 x weekly - 3 sets - 10 reps Bird Dog - 1 x daily - 7 x weekly - 2 sets - 10 reps Seated Slump Neural Tensioner - 1 x daily - 7 x weekly - 1 sets - 10 reps Supine Hamstring Stretch with Strap - 1 x daily - 7 x weekly - 1 sets - 2 reps - 30 sec hold Seated Hamstring Stretch - 1 x daily - 7 x weekly - 1 sets - 2 reps - 30 sec hold Standing Forward Trunk Flexion - 1 x daily - 7 x weekly - 1 sets - 2 reps - 30 sec hold Quadriceps Stretch with Chair - 1 x daily - 7 x weekly - 1 sets - 2 reps - 15 sec hold [facility] [address], [place], [place] [address] Phone # [phone] Fax [phone]

## 2020-02-05 ENCOUNTER — Ambulatory Visit: Payer: Managed Care, Other (non HMO) | Admitting: Physical Therapy

## 2020-02-17 ENCOUNTER — Telehealth: Payer: Self-pay | Admitting: Physical Therapy

## 2020-02-17 ENCOUNTER — Ambulatory Visit: Payer: Managed Care, Other (non HMO) | Attending: Pediatric Gastroenterology | Admitting: Physical Therapy

## 2020-02-17 NOTE — Telephone Encounter (Signed)
Called patient about missed appointment today at 800. Left a message.  Earlie Counts, PT @11 /17/2021@ 8:17 AM

## 2020-03-07 ENCOUNTER — Ambulatory Visit: Payer: Managed Care, Other (non HMO) | Attending: Pediatric Gastroenterology | Admitting: Physical Therapy

## 2020-03-07 ENCOUNTER — Encounter: Payer: Self-pay | Admitting: Physical Therapy

## 2020-03-07 ENCOUNTER — Other Ambulatory Visit: Payer: Self-pay

## 2020-03-07 DIAGNOSIS — R252 Cramp and spasm: Secondary | ICD-10-CM | POA: Insufficient documentation

## 2020-03-07 DIAGNOSIS — M6281 Muscle weakness (generalized): Secondary | ICD-10-CM

## 2020-03-07 DIAGNOSIS — R159 Full incontinence of feces: Secondary | ICD-10-CM

## 2020-03-07 DIAGNOSIS — K5900 Constipation, unspecified: Secondary | ICD-10-CM | POA: Insufficient documentation

## 2020-03-07 NOTE — Patient Instructions (Signed)
Access Code: [email or web address] URL: [email or web address] Date: 03/07/2020 Prepared by: Earlie Counts  Exercises Supine Abdominal Wall Massage - 1 x daily - 7 x weekly - 1 sets - 10 reps Seated Piriformis Stretch with Trunk Bend - 1 x daily - 7 x weekly - 1 sets - 2 reps - 30 sec hold Seated Hamstring Stretch - 1 x daily - 7 x weekly - 2 sets - 2 reps - 30 sec hold Standing ITB Stretch - 1 x daily - 7 x weekly - 1 sets - 2 reps - 15 sec hold Child's Pose Stretch - 1 x daily - 7 x weekly - 3 sets - 1 reps - 30 sec hold Hooklying Isometric Hip Flexion with Opposite Arm - 1 x daily - 7 x weekly - 1 sets - 10 reps - 5 sec hold Bridge with Hip Abduction and Resistance - 1 x daily - 7 x weekly - 3 sets - 10 reps Bird Dog - 1 x daily - 7 x weekly - 2 sets - 10 reps Seated Slump Neural Tensioner - 1 x daily - 7 x weekly - 1 sets - 10 reps Supine Hamstring Stretch with Strap - 1 x daily - 7 x weekly - 1 sets - 2 reps - 30 sec hold Seated Hamstring Stretch - 1 x daily - 7 x weekly - 1 sets - 2 reps - 30 sec hold Standing Forward Trunk Flexion - 1 x daily - 7 x weekly - 1 sets - 2 reps - 30 sec hold Quadriceps Stretch with Chair - 1 x daily - 7 x weekly - 1 sets - 2 reps - 15 sec hold Seated Pelvic Floor Contraction - 3 x daily - 7 x weekly - 1 sets - 5 reps - 5 sec hold [facility] [address], [place], [place] [address] Phone # [phone] Fax [phone]

## 2020-03-07 NOTE — Therapy (Signed)
[facility] [address] [place], [place], [address] Phone: [phone]   Fax:  [phone]  Physical Therapy Treatment  Patient Details  Name: Bradley [name] MRN: [id number] Date of Birth: Aug 11, 2000 Referring Provider (PT): Dr. Kandis Ban   Encounter Date: 03/07/2020   PT End of Session - 03/07/20 0856    Visit Number 14    Date for PT Re-Evaluation 03/23/20    Authorization Type [employer]    PT Start Time 0845    PT Stop Time 0925    PT Time Calculation (min) 40 min    Activity Tolerance Patient tolerated treatment well;No increased pain    Behavior During Therapy WFL for tasks assessed/performed           Past Medical History:  Diagnosis Date  . ADHD (attention deficit hyperactivity disorder)   . Anxiety   . Chronic constipation   . 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 GSE (gluten-sensitive enteropathy)   . Inflammatory bowel disease   . Lactose intolerance   . Mood disorder ([place])   . Scoliosis   . Short stature     Past Surgical History:  Procedure Laterality Date  . ADENOIDECTOMY    . COLONOSCOPY N/A 12/27/2015   Procedure: COLONOSCOPY;  Surgeon: Joycelyn Rua, MD;  Location: [facility];  Service: Gastroenterology;  Laterality: N/A;  . ESOPHAGOGASTRODUODENOSCOPY N/A 12/27/2015   Procedure: ESOPHAGOGASTRODUODENOSCOPY (EGD);  Surgeon: Joycelyn Rua, MD;  Location: [facility];  Service: Gastroenterology;  Laterality: N/A;  . ESOPHAGOGASTRODUODENOSCOPY N/A 08/03/2016   Procedure: ESOPHAGOGASTRODUODENOSCOPY (EGD);  Surgeon: Joycelyn Rua, MD;  Location: [facility];  Service: Gastroenterology;  Laterality: N/A;  . FLEXIBLE SIGMOIDOSCOPY  08/03/2016   Procedure: FLEXIBLE SIGMOIDOSCOPY;  Surgeon: Joycelyn Rua, MD;  Location: [facility];  Service: Gastroenterology;;  . tubes and adenoids      There were no vitals filed for this visit.   Subjective Assessment - 03/07/20 0849    Subjective Bradley [name]  been doing the stretches infrequently. I have been oversleeping so missed therapy. My recovery time is better. Time on commod is shorter. I do not use the other seat cushion due to pain.    Patient Stated Goals Not have to strain with BM; prevent fecal incontinence and toileting to interfere with his life; not worry about his toileting    Currently in Pain? Yes    Pain Score 7    typically 4-5/10   Pain Location Rectum    Pain Orientation Mid    Pain Descriptors / Indicators Aching;Sore    Pain Type Chronic pain    Pain Onset More than a month ago    Pain Frequency Intermittent    Aggravating Factors  sitting    Pain Relieving Factors take Alleve    Multiple Pain Sites No                             [facility] Adult PT Treatment/Exercise - 03/07/20 0001      Exercises   Exercises Other Exercises    Other Exercises  pelvic floor contraction holding for 5 sec 5 times      Lumbar Exercises: Stretches   Quadruped Mid Back Stretch 30 seconds;2 reps    Quadruped Mid Back Stretch Limitations prayer stretch      Lumbar Exercises: Supine   Bridge with clamshell 15 reps;1 second    Bridge with [employer] Limitations using blue loop     Isometric Hip  Flexion 10 reps;5 seconds    Isometric Hip Flexion Limitations each leg with pelvic floor contraction      Lumbar Exercises: Quadruped   Opposite Arm/Leg Raise 15 reps;Right arm/Left leg;Left arm/Right leg;1 second    Opposite Arm/Leg Raise Limitations keeping trunk control      Manual Therapy   Manual Therapy Soft tissue mobilization    Soft tissue mobilization using the prickly roller to bilateral hamstring, ITB band, gluteal, quadriceps                  PT Education - 03/07/20 0926    Education Details Access Code: [email or web address]    Person(s) Educated Patient    Methods Explanation;Demonstration;Verbal cues    Comprehension Returned demonstration;Verbalized understanding            PT Short Term Goals -  10/07/19 0841      PT SHORT TERM GOAL #1   Title independent with initial HEP    Time 4    Period Weeks    Status Achieved      PT SHORT TERM GOAL #2   Title rectal pain with bowel movement decreased >/= 25%    Baseline 65% better    Time 4    Period Weeks    Status Achieved      PT SHORT TERM GOAL #3   Title understand abdominal massage to move stool through the intestines and reduce straining with bowel movement    Time 4    Period Weeks    Status Achieved             PT Long Term Goals - 03/07/20 3662      PT LONG TERM GOAL #1   Title indpendent with HEP and understand how to progress himself    Time 12    Period Weeks    Status On-going      PT LONG TERM GOAL #2   Title rectal pain with sitting and after a bowel movement decreased >/= 75%    Baseline 75% better today but sometimes can be 90% better    Time 12    Period Weeks    Status On-going      PT LONG TERM GOAL #3   Title straining to have a bowel movement decreased >/= 75% due to ability to relax the pelvic floor and use the lower abdominals to assist    Baseline 80% better    Period Weeks    Status Achieved      PT LONG TERM GOAL #4   Title fecal leakage decreased >/= 75% so he not having to wear a 1 or less depends per day    Baseline 75% better, 2 depends per day    Time 12    Period Weeks    Status On-going      PT LONG TERM GOAL #5   Title stand for long period of time with rectal pain decreased >/= 50% and his legs feel weak minimally    Baseline 75% better and changes each session    Time 12    Period Weeks    Status Achieved                 Plan - 03/07/20 0927    Clinical Impression Statement Patietn reports he strains 80% less. Patient continues to have fecal leakage and changes his depends 2 times per day. Patient is spending less time on the commode. Patient is having less recovery time after having a  bowel movement. Patient has blood in his stool still. Patient will take  Alleve for rectal pain that is typically 4-5/10 and can increase to 7/10. Patient will benefit from skilled therapy to reduce pain and improve pelvic floor coordination.    Personal Factors and Comorbidities Comorbidity 3+    Comorbidities ADHD, mood disorder; inflammatory bowel disease    Examination-Activity Limitations Continence;Toileting;Stand;Sit    Examination-Participation Restrictions Community Activity;Driving    Stability/Clinical Decision Making Evolving/Moderate complexity    Rehab Potential Good    PT Frequency 1x / week    PT Duration 8 weeks    PT Treatment/Interventions Biofeedback;Cryotherapy;Electrical Stimulation;Moist Heat;Neuromuscular re-education;Therapeutic activities;Patient/family education;Manual techniques;Dry needling    PT Next Visit Plan see if patient continues or needs a renewal    PT Home Exercise Plan Access Code: [email or web address]    Consulted and Agree with Plan of Care Patient           Patient will benefit from skilled therapeutic intervention in order to improve the following deficits and impairments:  Decreased coordination, Increased fascial restricitons, Pain, Decreased endurance, Decreased activity tolerance, Decreased strength  Visit Diagnosis: Cramp and spasm  Muscle weakness (generalized)  Fecal incontinence alternating with constipation     Problem List Patient Active Problem List   Diagnosis Date Noted  . Unspecified severe protein-calorie malnutrition ([place]) 10/13/2018  . Crohn's disease with rectal bleeding ([place]) 10/13/2018  . Iatrogenic adrenal insufficiency ([place]) 05/20/2018  . Constipation 10/14/2017  . Solitary rectal ulcer syndrome 10/14/2017  . Idiopathic scoliosis 12/13/2015  . Growth hormone deficiency ([place]) 12/01/2013  . Abnormal renal ultrasound 11/27/2013  . Lack of expected normal physiological development 06/18/2013  . ADHD (attention deficit hyperactivity disorder)   . Mood disorder ([place])   . Short stature   .  Psoriasis 02/27/2012  . 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 History of constipation     Earlie Counts, PT 03/07/20 9:30 AM   [facility] [address], [place], [place], [address] Phone: [phone]   Fax:  [phone]  Name: Bradley [name] MRN: [id number] Date of Birth: [birth year]

## 2020-03-21 ENCOUNTER — Ambulatory Visit: Payer: Managed Care, Other (non HMO) | Admitting: Physical Therapy

## 2020-03-21 ENCOUNTER — Encounter: Payer: Self-pay | Admitting: Physical Therapy

## 2020-03-21 ENCOUNTER — Other Ambulatory Visit: Payer: Self-pay

## 2020-03-21 DIAGNOSIS — R252 Cramp and spasm: Secondary | ICD-10-CM

## 2020-03-21 DIAGNOSIS — K5900 Constipation, unspecified: Secondary | ICD-10-CM

## 2020-03-21 DIAGNOSIS — R159 Full incontinence of feces: Secondary | ICD-10-CM

## 2020-03-21 DIAGNOSIS — M6281 Muscle weakness (generalized): Secondary | ICD-10-CM

## 2020-03-21 NOTE — Patient Instructions (Addendum)
Way to manage pain:  1. Sit on ice for 10 min.  2. Sit upright with equal weight on the buttocks, upright, legs slightly apart. You can tilt your pelvic back and forth.  3. Stretches: Happy baby pose, pigeon pose, reverse clam 4. Diaphragmatic breathing to bulge the pelvic floor  Access Code: [email or web address] URL: [email or web address] Date: 03/21/2020 Prepared by: Earlie Counts  Program Notes  Way to manage pain:  1. Sit on ice for 10 min.  2. Sit upright with equal weight on the buttocks, upright, legs slightly apart. You can tilt your pelvic back and forth.  3. Stretches: Happy baby pose, pigeon pose, reverse clam 4. Diaphragmatic breathing to bulge the pelvic floor    Exercises Supine Pelvic Floor Stretch - 1 x daily - 7 x weekly - 1 sets - 1 reps - 60 sec hold Sidelying Reverse Clamshell - 1 x daily - 7 x weekly - 1 sets - 2 reps - 10 sec hold Pigeon Pose - 1 x daily - 7 x weekly - 1 sets - 2 reps - 30 sec hold Supine Abdominal Wall Massage - 1 x daily - 7 x weekly - 1 sets - 10 reps Seated Piriformis Stretch with Trunk Bend - 1 x daily - 7 x weekly - 1 sets - 2 reps - 30 sec hold Seated Hamstring Stretch - 1 x daily - 7 x weekly - 2 sets - 2 reps - 30 sec hold Standing ITB Stretch - 1 x daily - 7 x weekly - 1 sets - 2 reps - 15 sec hold Child's Pose Stretch - 1 x daily - 7 x weekly - 3 sets - 1 reps - 30 sec hold Hooklying Isometric Hip Flexion with Opposite Arm - 1 x daily - 7 x weekly - 1 sets - 10 reps - 5 sec hold Bridge with Hip Abduction and Resistance - 1 x daily - 7 x weekly - 3 sets - 10 reps Bird Dog - 1 x daily - 7 x weekly - 2 sets - 10 reps Seated Slump Neural Tensioner - 1 x daily - 7 x weekly - 1 sets - 10 reps Supine Hamstring Stretch with Strap - 1 x daily - 7 x weekly - 1 sets - 2 reps - 30 sec hold Seated Hamstring Stretch - 1 x daily - 7 x weekly - 1 sets - 2 reps - 30 sec hold Standing Forward Trunk Flexion - 1 x daily - 7 x weekly - 1 sets - 2 reps  - 30 sec hold Quadriceps Stretch with Chair - 1 x daily - 7 x weekly - 1 sets - 2 reps - 15 sec hold Seated Pelvic Floor Contraction - 3 x daily - 7 x weekly - 1 sets - 5 reps - 5 sec hold [facility] [address] [place], [place] [address] Phone # [phone] Fax [phone]

## 2020-03-21 NOTE — Therapy (Addendum)
[facility] [address] [place], [place], [address] Phone: [phone]   Fax:  [phone]  Physical Therapy Treatment  Patient Details  Name: Bradley [name] MRN: [id number] Date of Birth: 03-24-2001 Referring Provider (PT): Dr. Kandis Ban   Encounter Date: 03/21/2020   PT End of Session - 03/21/20 0857    Visit Number 15    Date for PT Re-Evaluation 06/13/20    Authorization Type [employer]    PT Start Time 0845    PT Stop Time 0925    PT Time Calculation (min) 40 min    Activity Tolerance Patient tolerated treatment well;No increased pain    Behavior During Therapy WFL for tasks assessed/performed           Past Medical History:  Diagnosis Date  . ADHD (attention deficit hyperactivity disorder)   . Anxiety   . Chronic constipation   . 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 GSE (gluten-sensitive enteropathy)   . Inflammatory bowel disease   . Lactose intolerance   . Mood disorder ([place])   . Scoliosis   . Short stature     Past Surgical History:  Procedure Laterality Date  . ADENOIDECTOMY    . COLONOSCOPY N/A 12/27/2015   Procedure: COLONOSCOPY;  Surgeon: Joycelyn Rua, MD;  Location: [place];  Service: Gastroenterology;  Laterality: N/A;  . ESOPHAGOGASTRODUODENOSCOPY N/A 12/27/2015   Procedure: ESOPHAGOGASTRODUODENOSCOPY (EGD);  Surgeon: Joycelyn Rua, MD;  Location: [place];  Service: Gastroenterology;  Laterality: N/A;  . ESOPHAGOGASTRODUODENOSCOPY N/A 08/03/2016   Procedure: ESOPHAGOGASTRODUODENOSCOPY (EGD);  Surgeon: Joycelyn Rua, MD;  Location: [place];  Service: Gastroenterology;  Laterality: N/A;  . FLEXIBLE SIGMOIDOSCOPY  08/03/2016   Procedure: FLEXIBLE SIGMOIDOSCOPY;  Surgeon: Joycelyn Rua, MD;  Location: [place];  Service: Gastroenterology;;  . tubes and adenoids      There were no vitals filed for this visit.   Subjective Assessment - 03/21/20 0851    Subjective The past  few days have not been great. I had fast food and I have had constant rectal pain 6-7/10. I have not been able to move due to the pain. I am not having back pain.    Patient Stated Goals Not have to strain with BM; prevent fecal incontinence and toileting to interfere with his life; not worry about his toileting    Currently in Pain? 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 Kandis Ban    Prior Therapy yes      Precautions   Precautions None      Restrictions   Weight Bearing Restrictions No      Home Environment   Living Environment Private residence      Cognition   Overall Cognitive Status Within Functional Limits for tasks assessed      Posture/Postural Control   Posture/Postural Control Postural limitations  Posture Comments large rib hump on the right thoracic       Strength   Right Hip Flexion 4/5   with pain   Right Hip ABduction 3+/5   with pain   Left Hip ADduction 4/5      Palpation   Palpation comment tenderness located on right ASIS and hip flexor tendon                      Pelvic Floor Special Questions - 03/21/20 0001    Fecal incontinence Yes   2 depends   Biofeedback resting tone is 1 uv, laying in sidely, going up ramp with control; sitting with ramp program; sitting step program    Biofeedback sensor type Surface   rectal            [facility] Adult PT Treatment/Exercise - 03/21/20 0001      Therapeutic Activites    Therapeutic Activities ADL's    ADL's sitting posture with equal  weight on both buttocks, being upright, feet apart and tilting pelvis back and forth      Lumbar Exercises: Stretches   Piriformis Stretch Right;Left;1 rep;30 seconds    Piriformis Stretch Limitations pigeon pose    Other Lumbar Stretch Exercise tried sitting on foam roll to massage the muscles but felt inflammed    Other Lumbar Stretch Exercise happy baby stretch; reverse clam holding on 10 sec bil.      Modalities   Modalities Cryotherapy      Moist Heat Therapy   Number Minutes Moist Heat 10 Minutes    Moist Heat Location Other (comment)   sit on ice in the rectal area                 PT Education - 03/21/20 0923    Education Details Access Code: [email or web address]; ways to manage rectal pain    Person(s) Educated Patient    Methods Explanation;Demonstration;Verbal cues;Handout    Comprehension Returned demonstration;Verbalized understanding            PT Short Term Goals - 10/07/19 0841      PT SHORT TERM GOAL #1   Title independent with initial HEP    Time 4    Period Weeks    Status Achieved      PT SHORT TERM GOAL #2   Title rectal pain with bowel movement decreased >/= 25%    Baseline 65% better    Time 4    Period Weeks    Status Achieved      PT SHORT TERM GOAL #3   Title understand abdominal massage to move stool through the intestines and reduce straining with bowel movement    Time 4    Period Weeks    Status Achieved             PT Long Term Goals - 03/21/20 5409      PT LONG TERM GOAL #1   Title indpendent with HEP and understand how to progress himself    Baseline still learning as he progresses    Time 12    Period Weeks    Status On-going      PT LONG TERM GOAL #2   Title rectal pain with sitting and after a bowel movement decreased >/= 75%    Baseline tihs week the pain was 7/10 and had a set back    Time 12    Period Weeks    Status On-going  PT LONG TERM GOAL #3   Title straining to have a bowel movement decreased >/= 75%  due to ability to relax the pelvic floor and use the lower abdominals to assist    Baseline 80% better    Time 12    Period Weeks    Status Achieved      PT LONG TERM GOAL #4   Title fecal leakage decreased >/= 75% so he not having to wear a 1 or less depends per day    Baseline 75% better, 2 depends per day ;with more blood    Time 12    Status On-going      PT LONG TERM GOAL #5   Title stand for long period of time with rectal pain decreased >/= 50% and his legs feel weak minimally    Baseline 75% better and changes each session    Time 12    Period Weeks    Status Achieved      Additional Long Term Goals   Additional Long Term Goals Yes      PT LONG TERM GOAL #6   Title understand ways to manage his rectal pain and reduce it to reduce the flare-ups    Time 12    Period Days    Status New    Target Date 06/13/20                 Plan - 03/21/20 0859    Clinical Impression Statement Patient has had a flare-up after eating fast food. Today his pain is 7/10. Today we focused on how to reduce and manage his pain at home. After the therapy today his pain reduced to 3/10. Patient continues to sit with slouched posture, legs together, and leaning on the right side. Patiient was educated on how to sit with equal weight on his buttocks to reduce tightness on one side more than the other. Patient is straining less. Patient understands to use ice on the rectal area when having pain. Patient continues to wear 2 depends per day. He has fecal and blood leakage. Patient will benefit from skilled therapy to reduce his pain and improve pelvic floor coordination.    Personal Factors and Comorbidities Comorbidity 3+    Comorbidities ADHD, mood disorder; inflammatory bowel disease    Examination-Activity Limitations Continence;Toileting;Stand;Sit    Examination-Participation Restrictions Community Activity;Driving    Stability/Clinical Decision Making Evolving/Moderate complexity    PT  Frequency 1x / week    PT Duration 12 weeks    PT Treatment/Interventions Biofeedback;Cryotherapy;Electrical Stimulation;Moist Heat;Neuromuscular re-education;Therapeutic activities;Patient/family education;Manual techniques;Dry needling    PT Next Visit Plan work on pain management; work on pelvic floor drop; quadruped with manual work to elongate the pelvic floor muscles    PT Home Exercise Plan Access Code: [email or web address]    Recommended Other Services sent MD renewal    Consulted and Agree with Plan of Care Patient           Patient will benefit from skilled therapeutic intervention in order to improve the following deficits and impairments:  Decreased coordination,Increased fascial restricitons,Pain,Decreased endurance,Decreased activity tolerance,Decreased strength  Visit Diagnosis: Cramp and spasm - Plan: PT plan of care cert/re-cert  Muscle weakness (generalized) - Plan: PT plan of care cert/re-cert  Fecal incontinence alternating with constipation - Plan: PT plan of care cert/re-cert     Problem List Patient Active Problem List   Diagnosis Date Noted  . Unspecified severe protein-calorie malnutrition ([place]) 10/13/2018  . Crohn's disease with rectal bleeding (  [place]) 10/13/2018  . Iatrogenic adrenal insufficiency ([place]) 05/20/2018  . Constipation 10/14/2017  . Solitary rectal ulcer syndrome 10/14/2017  . Idiopathic scoliosis 12/13/2015  . Growth hormone deficiency ([place]) 12/01/2013  . Abnormal renal ultrasound 11/27/2013  . Lack of expected normal physiological development 06/18/2013  . ADHD (attention deficit hyperactivity disorder)   . Mood disorder ([place])   . Short stature   . Psoriasis 02/27/2012  . 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 History of constipation     Earlie Counts, PT 03/21/20 3:01 PM   [facility] [address], [place], [place], [address] Phone: [phone]   Fax:  [phone]  Name: HUDSYN CHAMPINE MRN: [id number] Date of Birth: 03-22-2001  PHYSICAL THERAPY DISCHARGE SUMMARY  Visits from Start of Care: 15  Current functional level related to goals / functional outcomes: See above.    Remaining deficits: See above.   Education / Equipment: HEP Plan:                                                    Patient goals were met. Patient is being discharged due to not returning since the last visit. Thank you for the referral. Earlie Counts, PT 06/27/20 10:41 AM  ????

## 2020-05-15 ENCOUNTER — Encounter (INDEPENDENT_AMBULATORY_CARE_PROVIDER_SITE_OTHER): Payer: Self-pay

## 2020-05-16 ENCOUNTER — Other Ambulatory Visit (INDEPENDENT_AMBULATORY_CARE_PROVIDER_SITE_OTHER): Payer: Self-pay

## 2020-05-16 DIAGNOSIS — K626 Ulcer of anus and rectum: Secondary | ICD-10-CM

## 2020-05-16 MED ORDER — PREDNISONE 10 MG PO TABS: ORAL_TABLET | ORAL | 1 refills | Status: DC

## 2020-06-26 ENCOUNTER — Encounter (INDEPENDENT_AMBULATORY_CARE_PROVIDER_SITE_OTHER): Payer: Self-pay

## 2020-06-27 ENCOUNTER — Other Ambulatory Visit (INDEPENDENT_AMBULATORY_CARE_PROVIDER_SITE_OTHER): Payer: Self-pay

## 2020-06-27 DIAGNOSIS — K626 Ulcer of anus and rectum: Secondary | ICD-10-CM

## 2020-06-27 MED ORDER — PREDNISONE 10 MG PO TABS: ORAL_TABLET | ORAL | 1 refills | Status: DC

## 2020-07-12 ENCOUNTER — Encounter (INDEPENDENT_AMBULATORY_CARE_PROVIDER_SITE_OTHER): Payer: Self-pay | Admitting: Dietician

## 2020-09-05 ENCOUNTER — Encounter (INDEPENDENT_AMBULATORY_CARE_PROVIDER_SITE_OTHER): Payer: Self-pay

## 2020-09-05 ENCOUNTER — Telehealth (INDEPENDENT_AMBULATORY_CARE_PROVIDER_SITE_OTHER): Payer: Self-pay | Admitting: Pediatric Gastroenterology

## 2020-09-05 ENCOUNTER — Ambulatory Visit (INDEPENDENT_AMBULATORY_CARE_PROVIDER_SITE_OTHER): Payer: BC Managed Care – PPO | Admitting: Pediatric Gastroenterology

## 2020-09-05 ENCOUNTER — Encounter (INDEPENDENT_AMBULATORY_CARE_PROVIDER_SITE_OTHER): Payer: Self-pay | Admitting: Pediatric Gastroenterology

## 2020-09-05 ENCOUNTER — Other Ambulatory Visit: Payer: Self-pay

## 2020-09-05 VITALS — BP 120/78 | HR 112 | Ht 63.11 in | Wt 108.2 lb

## 2020-09-05 DIAGNOSIS — K626 Ulcer of anus and rectum: Secondary | ICD-10-CM

## 2020-09-05 DIAGNOSIS — K6289 Other specified diseases of anus and rectum: Secondary | ICD-10-CM

## 2020-09-05 DIAGNOSIS — K594 Anal spasm: Secondary | ICD-10-CM | POA: Diagnosis not present

## 2020-09-05 MED ORDER — LIDOCAINE (ANORECTAL) 50 MG RE SUPP: 50.0000 mg | Freq: Two times a day (BID) | RECTAL | 0 refills | Status: DC

## 2020-09-05 NOTE — Telephone Encounter (Signed)
Returned call to pharmacy. Spoke to representative that stated that they have no lidocaine suppositories, and to check with a compounding pharmacy if this is needed. Representative also stated that there is 5% Lidocaine anorectal cream that the pharmacy can possibly order. Pharmacy will call our office if they have trouble ordering this product. I verbalized understanding and ended the call.

## 2020-09-05 NOTE — Addendum Note (Signed)
Addended by: Gunnar Bulla on: 09/05/2020 05:02 PM   Modules accepted: Orders

## 2020-09-05 NOTE — Progress Notes (Signed)
Pediatric Gastroenterology Return Visit   REFERRING PROVIDER:  No referring provider defined for this encounter.   ASSESSMENT:     I had the pleasure of seeing ASHAAD GAERTNER, 20 y.o. male (DOB: [birth year]) who I saw in follow up today for evaluation of solitary rectal ulcer syndrome, skin tags, constipation, recurrent rectal prolapse,  s/p C difficile infection, in the context of growth hormone deficiency, off growth hormone therapy.  He has rectal pain today triggered by anxiety. My examination shows a contracted anal sphincter with scar tissue and a small amount of prolapsed mucosa.   He underwent anorectal manometry at [facility] on 07/06/19 (see below), which showed evidence of pelvic floor dyssynergia. Physical therapy may help him. I discussed his results with him.  Prednisone alleviates episodes of rectal pain, but his pain is now refractory to prednisone.  He is also on inhaled albuterol to relief rectal pain (proctalgia fugax), which has helped him.  Perianal tacrolimus caused skin burning and he stopped it.  I think that he needs exam under anesthesia to reassess the integrity of his anal sphincter.  I encouraged him to continue communicating via MyChart.      PLAN:       Lidocaine 50 mg BID for rectal pain - do not take if pain is absent Set up EUA at [facility]  Touch base in 3 months   Thank you for allowing [place] to participate in the care of your patient     HISTORY OF PRESENT ILLNESS: LOWRY BALA is a 20 y.o. male (DOB: 2001/01/21) who is seen in follow up for evaluation of solitary rectal ulcer syndrome with perianal inflammation with skin tages. History was obtained from [place]. He is complaining of rectal pain, which does not respond to usual treatment. It was triggered by conversations with a girl that he is interested in. He has soft stools. He does not see blood in the stool. Rectal pain makes it difficult for him to go to sleep and pain wakes him up.    PAST MEDICAL  HISTORY: Past Medical History:  Diagnosis Date  . ADHD (attention deficit hyperactivity disorder)   . Anxiety   . Chronic constipation   . 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 GSE (gluten-sensitive enteropathy)   . Inflammatory bowel disease   . Lactose intolerance   . Mood disorder ([place])   . Scoliosis   . Short stature    Immunization History  Administered Date(s) Administered  . HPV 9-valent 01/08/2019  . Hpv-Unspecified 07/23/2019  . Influenza, Quadrivalent, Recombinant, Inj, Pf 01/08/2019  . Influenza-Unspecified 01/10/2019   PAST SURGICAL HISTORY: Past Surgical History:  Procedure Laterality Date  . ADENOIDECTOMY    . COLONOSCOPY N/A 12/27/2015   Procedure: COLONOSCOPY;  Surgeon: Joycelyn Rua, MD;  Location: [place];  Service: Gastroenterology;  Laterality: N/A;  . ESOPHAGOGASTRODUODENOSCOPY N/A 12/27/2015   Procedure: ESOPHAGOGASTRODUODENOSCOPY (EGD);  Surgeon: Joycelyn Rua, MD;  Location: [place];  Service: Gastroenterology;  Laterality: N/A;  . ESOPHAGOGASTRODUODENOSCOPY N/A 08/03/2016   Procedure: ESOPHAGOGASTRODUODENOSCOPY (EGD);  Surgeon: Joycelyn Rua, MD;  Location: [place];  Service: Gastroenterology;  Laterality: N/A;  . FLEXIBLE SIGMOIDOSCOPY  08/03/2016   Procedure: FLEXIBLE SIGMOIDOSCOPY;  Surgeon: Joycelyn Rua, MD;  Location: [facility]COPY;  Service: Gastroenterology;;  . tubes and adenoids     SOCIAL HISTORY: Social History   Socioeconomic History  . Marital status: Single    Spouse name: Not on file  . Number of children: Not on file  . Years of education: Not  on file  . Highest education level: Not on file  Occupational History  . Not on file  Tobacco Use  . Smoking status: Never Smoker  . Smokeless tobacco: Never Used  Vaping Use  . Vaping Use: Never used  Substance and Sexual Activity  . Alcohol use: No  . Drug use: No  . Sexual activity: Not Currently    Birth control/protection: None  Other Topics Concern  . Not on file  Social History  Narrative   Lives at home with mom dad and brother. 12 th grade    Social Determinants of Health   Financial Resource Strain: Not on file  Food Insecurity: Not on file  Transportation Needs: Not on file  Physical Activity: Not on file  Stress: Not on file  Social Connections: Not on file   FAMILY HISTORY: family history includes Cancer in his maternal grandfather, paternal grandfather, and paternal grandmother; Depression in his maternal aunt, maternal grandmother, and mother; Diabetes in his paternal grandfather; Hyperlipidemia in his maternal grandfather, maternal grandmother, paternal grandfather, and paternal grandmother; Hypertension in his maternal grandfather; Miscarriages / [place] in his mother; Thyroid disease in his paternal grandfather and paternal grandmother.   REVIEW OF SYSTEMS:  The balance of 12 systems reviewed is negative except as noted in the HPI.  MEDICATIONS: Current Outpatient Medications  Medication Sig Dispense Refill  . albuterol (VENTOLIN HFA) 108 (90 Base) MCG/ACT inhaler Inhale 2 puffs into the lungs every 6 (six) hours as needed for wheezing or shortness of breath. 1 Inhaler 2  . docusate sodium (COLACE) 100 MG capsule Take by mouth.    . escitalopram (LEXAPRO) 20 MG tablet Take 30 mg by mouth daily.     Marland Kitchen lamoTRIgine (LAMICTAL) 150 MG tablet Take 1 tablet (150 mg total) by mouth 2 (two) times daily. 60 tablet 1  . loperamide (LOPERAMIDE A-D) 2 MG tablet Take by mouth.    . methylphenidate 27 MG PO TB24     . predniSONE (DELTASONE) 10 MG tablet 40 mg daily for 5 days 20 tablet 1  . traZODone (DESYREL) 50 MG tablet      No current facility-administered medications for this visit.   ALLERGIES: Trileptal [oxcarbazepine]  VITAL SIGNS: There were no vitals taken for this visit. PHYSICAL EXAM: Constitutional: Alert, no acute distress, well nourished, and well hydrated.  Mental Status: Pleasantly interactive, not anxious appearing. HEENT: PERRL,  conjunctiva clear, anicteric, oropharynx clear, neck supple, no LAD. Respiratory: Clear to auscultation, unlabored breathing. Cardiac: Euvolemic, regular rate and rhythm, normal S1 and S2, no murmur. Abdomen: Soft, normal bowel sounds, non-distended, non-tender, no organomegaly or masses. Perianal/Rectal Exam: Scarred skin tag. Small amount of rectal mucosa visible through anal oriffice. Extremities: No edema, well perfused. Musculoskeletal: No joint swelling or tenderness noted, no deformities. Skin: No rashes, jaundice or skin lesions noted. Neuro: No focal deficits.   Anorectal manometry 07/06/19  Impression:      - Resting study reveals a normal internal anal              sphincter pressure.             - Squeeze study reveals a normal external anal              sphincter pressure.             - RAIR (Rectoanal Inhibitory Reflex) is present              suggesting absence of Hirschsprung's disease.             -  Sensation study reveals a normal first sensation              threshold with low urge and normal maximal tolerated              volume thresholds.             - Unable to expel defecation balloon.             - Elevated pelvic floor resting activity on EMG.             - Low maximal squeeze activity on EMG.             - Strain manuever reveals an increase in pelvic floor              activity with strain.             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  Marybelle Giraldo A. Yehuda Savannah, MD Chief, Division of Pediatric Gastroenterology Professor of Pediatrics

## 2020-09-05 NOTE — Patient Instructions (Signed)
Contact information For emergencies after hours, on holidays or weekends: call [phone] and ask for the pediatric gastroenterologist on call.  For regular business hours: Pediatric GI phone number: Darlina Sicilian) [place] [phone] OR Use MyChart to send messages  A special favor Our waiting list is over 2 months. Other children are waiting to be seen in our clinic. If you cannot make your next appointment, please contact us with at least 2 days notice to cancel and reschedule. Your timely phone call will allow another child to use the clinic slot.  Thank you!

## 2020-09-05 NOTE — Telephone Encounter (Signed)
Patient has some paperwork that needs to be filled out by Dr. Yehuda Savannah. Paperwork was faxed to [place] and is in Cori's box. Patient requests MyChart message when paperwork has been processed. Call back number is [phone].

## 2020-09-05 NOTE — Telephone Encounter (Signed)
Presented form to Dr. Yehuda Savannah. Dr. Yehuda Savannah stated that Bradley [name] needs to fill the form out and then send it to [place] for consideration, as we do not know what he is requesting for FMLA. Will relay this information to Teegan through My Chart.

## 2020-09-05 NOTE — Telephone Encounter (Signed)
Who's calling (name and relationship to patient) : Kristopher Oppenheim  Best contact number: [phone]  Provider they see: Dr. Yehuda Savannah  Reason for call: Unable to find the lidocaine product. Wants to know if the Rx should be rewritten  Call ID:      PRESCRIPTION REFILL ONLY  Name of prescription:  Pharmacy:

## 2020-09-07 ENCOUNTER — Encounter (INDEPENDENT_AMBULATORY_CARE_PROVIDER_SITE_OTHER): Payer: Self-pay

## 2020-09-07 NOTE — Telephone Encounter (Signed)
Patient states that he needs doctor's note immediately because his job is on the line.

## 2020-09-08 ENCOUNTER — Encounter (INDEPENDENT_AMBULATORY_CARE_PROVIDER_SITE_OTHER): Payer: Self-pay

## 2020-09-12 NOTE — Telephone Encounter (Signed)
Patient has called asking about his FMLA paperwork stating that there are only two days left to have it filled out.

## 2020-09-12 NOTE — Telephone Encounter (Signed)
Sent email to provider to follow up

## 2020-09-14 ENCOUNTER — Encounter (INDEPENDENT_AMBULATORY_CARE_PROVIDER_SITE_OTHER): Payer: Self-pay

## 2020-09-14 NOTE — Telephone Encounter (Signed)
Sent My Chart message explaining the 14 day paperwork policy for our office and that I will get this to him as soon as I can. I also emailed Dr. Yehuda Savannah to follow up on this.

## 2020-09-14 NOTE — Telephone Encounter (Signed)
Patient called stating that this paperwork still hasn't been filled out. Please call with an update of some kind.

## 2020-09-19 ENCOUNTER — Telehealth (INDEPENDENT_AMBULATORY_CARE_PROVIDER_SITE_OTHER): Payer: Self-pay | Admitting: Pediatric Gastroenterology

## 2020-09-19 NOTE — Telephone Encounter (Signed)
  Who's calling (name and relationship to patient) : [name] (Self) Best contact number: [phone] (Home) Provider they see: Larose Hires, MD Reason for call: Bradley [name] needs a document filled out for his job. The document is for time off having to do with medical reasons. Steve will e-mail the form to [email or web address] so that it can be completed and returned. Please contact Bradley [name] to discuss the changes that date changes that need to be made on the form    [place]  Name of prescription:  Pharmacy:

## 2020-09-21 NOTE — Telephone Encounter (Signed)
Calling and asking about update for paperwork needed for work.

## 2020-09-23 ENCOUNTER — Encounter (INDEPENDENT_AMBULATORY_CARE_PROVIDER_SITE_OTHER): Payer: Self-pay

## 2020-09-23 NOTE — Telephone Encounter (Signed)
Sent My-Chart message

## 2020-10-10 ENCOUNTER — Encounter (INDEPENDENT_AMBULATORY_CARE_PROVIDER_SITE_OTHER): Payer: Self-pay

## 2021-01-19 HISTORY — PX: RECTOPEXY: SHX5700

## 2021-04-18 ENCOUNTER — Telehealth (INDEPENDENT_AMBULATORY_CARE_PROVIDER_SITE_OTHER): Payer: Self-pay | Admitting: Pediatric Gastroenterology

## 2021-04-18 ENCOUNTER — Encounter (INDEPENDENT_AMBULATORY_CARE_PROVIDER_SITE_OTHER): Payer: Self-pay

## 2021-04-18 ENCOUNTER — Emergency Department (HOSPITAL_BASED_OUTPATIENT_CLINIC_OR_DEPARTMENT_OTHER)
Admission: EM | Admit: 2021-04-18 | Discharge: 2021-04-18 | Disposition: A | Discharge status: Home / Self Care | Payer: BC Managed Care – PPO | Attending: Emergency Medicine | Admitting: Emergency Medicine

## 2021-04-18 ENCOUNTER — Encounter (HOSPITAL_BASED_OUTPATIENT_CLINIC_OR_DEPARTMENT_OTHER): Payer: Self-pay

## 2021-04-18 ENCOUNTER — Other Ambulatory Visit: Payer: Self-pay

## 2021-04-18 DIAGNOSIS — K623 Rectal prolapse: Secondary | ICD-10-CM | POA: Insufficient documentation

## 2021-04-18 DIAGNOSIS — Z20822 Contact with and (suspected) exposure to covid-19: Secondary | ICD-10-CM | POA: Insufficient documentation

## 2021-04-18 DIAGNOSIS — K625 Hemorrhage of anus and rectum: Secondary | ICD-10-CM | POA: Diagnosis present

## 2021-04-18 LAB — COMPREHENSIVE METABOLIC PANEL
ALT: 14 U/L (ref 0–44)
AST: 17 U/L (ref 15–41)
Albumin: 4.2 g/dL (ref 3.5–5.0)
Alkaline Phosphatase: 113 U/L (ref 38–126)
Anion gap: 8 (ref 5–15)
BUN: 12 mg/dL (ref 6–20)
CO2: 30 mmol/L (ref 22–32)
Calcium: 9.4 mg/dL (ref 8.9–10.3)
Chloride: 103 mmol/L (ref 98–111)
Creatinine, Ser: 0.9 mg/dL (ref 0.61–1.24)
GFR, Estimated: >60 mL/min (ref >60)
Glucose, Bld: 91 mg/dL (ref 70–99)
Potassium: 3.9 mmol/L (ref 3.5–5.1)
Sodium: 141 mmol/L (ref 135–145)
Total Bilirubin: 0.3 mg/dL (ref 0.3–1.2)
Total Protein: 7.2 g/dL (ref 6.5–8.1)

## 2021-04-18 LAB — CBC WITH DIFFERENTIAL/PLATELET
Abs Immature Granulocytes: 0.04 10*3/uL (ref 0.00–0.07)
Basophils Absolute: 0.1 10*3/uL (ref 0.0–0.1)
Basophils Relative: 1 %
Eosinophils Absolute: 0.2 10*3/uL (ref 0.0–0.5)
Eosinophils Relative: 2 %
HCT: 34.6 % — ABNORMAL LOW (ref 39.0–52.0)
Hemoglobin: 9.5 g/dL — ABNORMAL LOW (ref 13.0–17.0)
Immature Granulocytes: 0 %
Lymphocytes Relative: 22 %
Lymphs Abs: 2.3 10*3/uL (ref 0.7–4.0)
MCH: 20.7 pg — ABNORMAL LOW (ref 26.0–34.0)
MCHC: 27.5 g/dL — ABNORMAL LOW (ref 30.0–36.0)
MCV: 75.2 fL — ABNORMAL LOW (ref 80.0–100.0)
Monocytes Absolute: 0.8 10*3/uL (ref 0.1–1.0)
Monocytes Relative: 7 %
Neutro Abs: 7.1 10*3/uL (ref 1.7–7.7)
Neutrophils Relative %: 68 %
Platelets: 413 10*3/uL — ABNORMAL HIGH (ref 150–400)
RBC: 4.6 MIL/uL (ref 4.22–5.81)
RDW: 25.5 % — ABNORMAL HIGH (ref 11.5–15.5)
WBC: 10.5 10*3/uL (ref 4.0–10.5)
nRBC: 0 % (ref 0.0–0.2)

## 2021-04-18 LAB — LIPASE, BLOOD: Lipase: <10 U/L — ABNORMAL LOW (ref 11–51)

## 2021-04-18 LAB — LACTIC ACID, PLASMA: Lactic Acid, Venous: 1.1 mmol/L (ref 0.5–1.9)

## 2021-04-18 LAB — RESP PANEL BY RT-PCR (FLU A&B, COVID) ARPGX2
Influenza A by PCR: NEGATIVE
Influenza B by PCR: NEGATIVE
SARS Coronavirus 2 by RT PCR: NEGATIVE

## 2021-04-18 MED ORDER — LACTATED RINGERS IV BOLUS
1000.0000 mL | Freq: Once | INTRAVENOUS | Status: AC
  Administered 2021-04-18: 1000 mL via INTRAVENOUS

## 2021-04-18 MED ORDER — HYDROMORPHONE HCL 1 MG/ML IJ SOLN
1.0000 mg | Freq: Once | INTRAMUSCULAR | Status: AC
  Administered 2021-04-18: 1 mg via INTRAVENOUS
  Filled 2021-04-18: qty 1

## 2021-04-18 MED ORDER — FENTANYL CITRATE PF 50 MCG/ML IJ SOSY
25.0000 ug | PREFILLED_SYRINGE | INTRAMUSCULAR | Status: DC | PRN
  Administered 2021-04-18: 25 ug via INTRAVENOUS
  Filled 2021-04-18: qty 1

## 2021-04-18 NOTE — ED Triage Notes (Signed)
Patient here POV from Home with Rectal Pain.  Patient had uncomplicated Rectal Prolapse Surgery in September. A few days PTA he began to have Rectal Pain that has remained constant.  No N/V/D. Bright Red Blood in Stool  NAD Noted during Triage. A&Ox4. GCS 15. Ambulatory.

## 2021-04-18 NOTE — ED Provider Notes (Signed)
[place] EMERGENCY DEPT Provider Note   CSN: [id number] Arrival date & time: 04/18/21  1745     History  Chief Complaint  Patient presents with   Rectal Pain    Bradley [name] is a 21 y.o. male.  HPI  21 year old male with a history of ulcerative colitis, previous rectal prolapse requiring fixation with pediatric surgery at [facility] who presents emergency department with concern for rectal prolapse.  The patient states that he has had some rectal bleeding with concern for prolapse.  He endorses sharp and severe pain in his rectum.  He has had rectal pain that has been constant.  He endorses some bright red blood in his stool.  He denies any fevers or chills.  Home Medications Prior to Admission medications   Medication Sig Start Date End Date Taking? Authorizing Provider  albuterol (VENTOLIN HFA) 108 (90 Base) MCG/ACT inhaler Inhale 2 puffs into the lungs every 6 (six) hours as needed for wheezing or shortness of breath. Patient not taking: Reported on 09/05/2020 09/08/18   Kandis Ban, MD  docusate sodium (COLACE) 100 MG capsule Take by mouth. Patient not taking: Reported on 09/05/2020    [provider]  escitalopram (LEXAPRO) 20 MG tablet Take 30 mg by mouth daily.     [provider]  lamoTRIgine (LAMICTAL) 150 MG tablet Take 1 tablet (150 mg total) by mouth 2 (two) times daily. 08/28/17   [name], MD  loperamide (IMODIUM A-D) 2 MG tablet Take by mouth. Patient not taking: Reported on 09/05/2020    [provider]  methylphenidate 27 MG PO TB24  12/14/18   [provider]  predniSONE (DELTASONE) 10 MG tablet 40 mg daily for 5 days 06/27/20   Kandis Ban, MD  traZODone (DESYREL) 50 MG tablet  06/21/18   [provider]      Allergies    Trileptal [oxcarbazepine]    Review of Systems   Review of Systems  Gastrointestinal:  Positive for anal bleeding and rectal pain.  All other  systems reviewed and are negative.  Physical Exam Updated Vital Signs BP 127/87    Pulse 95    Temp 98.4 F (36.9 C) (Oral)    Resp (!) 21    Ht 5' 3.11" (1.603 m)    Wt 49.1 kg    SpO2 100%    BMI 19.11 kg/m  Physical Exam Vitals and nursing note reviewed. Exam conducted with a chaperone present.  Constitutional:      General: He is not in acute distress.    Appearance: He is well-developed.  HENT:     Head: Normocephalic and atraumatic.  Eyes:     Conjunctiva/sclera: Conjunctivae normal.  Cardiovascular:     Rate and Rhythm: Normal rate and regular rhythm.     Heart sounds: No murmur heard. Pulmonary:     Effort: Pulmonary effort is normal. No respiratory distress.     Breath sounds: Normal breath sounds.  Abdominal:     Palpations: Abdomen is soft.     Tenderness: There is no abdominal tenderness.  Genitourinary:    Comments: Rectal prolapse present as per the image below.  IV access was obtained and the patient was admitted Dilaudid for pain control and an IV fluid bolus.  Screening laboratory work-up was significant for a lactic acid that was normal at 1.1, CBC without a leukocytosis, anemia stable at 9.5, COVID-19 and influenza PCR testing negative, lipase normal, CMP Musculoskeletal:  General: No swelling.     Cervical back: Neck supple.  Skin:    General: Skin is warm and dry.     Capillary Refill: Capillary refill takes less than 2 seconds.  Neurological:     Mental Status: He is alert.  Psychiatric:        Mood and Affect: Mood normal.      ED Results / Procedures / Treatments   Labs (all labs ordered are listed, but only abnormal results are displayed) Labs Reviewed  LIPASE, BLOOD - Abnormal; Notable for the following components:      Result Value   Lipase <10 (*)    All other components within normal limits  CBC WITH DIFFERENTIAL/PLATELET - Abnormal; Notable for the following components:   Hemoglobin 9.5 (*)    HCT 34.6 (*)    MCV 75.2 (*)    MCH  20.7 (*)    MCHC 27.5 (*)    RDW 25.5 (*)    Platelets 413 (*)    All other components within normal limits  RESP PANEL BY RT-PCR (FLU A&B, COVID) [id number]  COMPREHENSIVE METABOLIC PANEL  LACTIC ACID, PLASMA    EKG None  Radiology No results found.  Procedures Procedures    Medications Ordered in ED Medications  lactated ringers bolus 1,000 mL (0 mLs Intravenous Stopped 04/18/21 2033)  HYDROmorphone (DILAUDID) injection 1 mg (1 mg Intravenous Given 04/18/21 2036)    ED Course/ Medical Decision Making/ A&P                           Medical Decision Making Amount and/or Complexity of Data Reviewed Labs: ordered.  Risk Prescription drug management.   21 year old male with a history of ulcerative colitis, previous rectal prolapse requiring fixation with pediatric surgery at [facility] who presents emergency department with concern for rectal prolapse.  The patient states that he has had some rectal bleeding with concern for prolapse.  He endorses sharp and severe pain in his rectum.  He has had rectal pain that has been constant.  He endorses some bright red blood in his stool.  He denies any fevers or chills.  On arrival, the patient was afebrile, mildly tachycardic, P105, saturating well on room air, hemodynamically stable.  Sinus tachycardia noted on cardiac telemetry.  The patient's physical exam was concerning for mucosal rectal prolapse.  Laboratory work-up significant for a normal lactic acid at 1.1, CBC without a leukocytosis, stable anemia to 9.5, COVID-19 and influenza PCR testing negative, lipase normal, CMP unremarkable.  I spoke with on-call pediatric surgery at [facility] who recommended attempts at reduction bedside in the ED after applying sugar to the exposed mucosa.  Table sugar was applied to the exposed mucosa and let sit for 15 minutes.  The patient was then consented for reduction and reduction was performed bedside with successful reduction of the patient's rectal  prolapse.  The patient tolerated the procedure and following this was tolerating oral intake with improved symptoms.  Advised him to call his pediatric surgeon in the morning for close follow-up.  Overall stable for discharge at this time.    Final Clinical Impression(s) / ED Diagnoses Final diagnoses:  Rectal prolapse    Rx / DC Orders ED Discharge Orders     None         Regan Lemming, MD 04/20/21 1654

## 2021-04-18 NOTE — ED Notes (Signed)
Assisted Lawsing, MD with applying sugar to rectal prolapse

## 2021-04-18 NOTE — ED Notes (Signed)
ED Provider at bedside. 

## 2021-04-18 NOTE — ED Notes (Signed)
Pt verbalizes understanding of discharge instructions. Opportunity for questioning and answers were provided. Pt discharged from ED to home with family.

## 2021-04-18 NOTE — Telephone Encounter (Signed)
°  Who's calling (name and relationship to patient) : Bradley [name]; self  Best contact number: [phone]  Provider they see: Dr. Yehuda Savannah  Reason for call: Bradley [name] has called in wanting to speak with [facility] regarding a referral from [facility] to [facility]. [name] has requested a call back.    PRESCRIPTION REFILL ONLY  Name of prescription:  Pharmacy:

## 2021-04-18 NOTE — Telephone Encounter (Signed)
Sent My-Chart message

## 2021-04-18 NOTE — Discharge Instructions (Signed)
You were evaluated in the Emergency Department and after careful evaluation, we did not find any emergent condition requiring admission or further testing in the hospital.  Your exam/testing today was overall concerning for rectal prolapse.  Sugar was applied to the prolapse which allowed the mucosal swelling to go down and the prolapse was successfully reduced bedside.  Recommend you call your pediatric surgeon tomorrow to schedule follow-up in clinic.  Continue taking your outpatient pain medications as prescribed.  Please return to the Emergency Department if you experience any worsening of your condition.  Thank you for allowing [place] to be a part of your care.

## 2021-04-18 NOTE — ED Notes (Signed)
First contact with patient. Pt arrived via triage from home with c/o rectal bleeding, concern for rectal prolapse. Pt. denies shob, is A&OX 4, resp. even/unlabored. Pt placed into gown and on cardiac monitor, call light within reach. Patient updated on plan of care. Will continue to monitor patient.

## 2021-04-19 ENCOUNTER — Encounter (INDEPENDENT_AMBULATORY_CARE_PROVIDER_SITE_OTHER): Payer: Self-pay

## 2021-04-27 ENCOUNTER — Other Ambulatory Visit (HOSPITAL_COMMUNITY): Payer: Self-pay | Admitting: *Deleted

## 2021-04-28 ENCOUNTER — Encounter (HOSPITAL_COMMUNITY)
Admission: RE | Admit: 2021-04-28 | Discharge: 2021-04-28 | Disposition: A | Discharge status: Home / Self Care | Payer: BC Managed Care – PPO | Source: Ambulatory Visit | Attending: Pediatric Gastroenterology | Admitting: Pediatric Gastroenterology

## 2021-04-28 DIAGNOSIS — K50911 Crohn's disease, unspecified, with rectal bleeding: Secondary | ICD-10-CM | POA: Diagnosis present

## 2021-04-28 MED ORDER — SODIUM CHLORIDE 0.9 % IV SOLN
125.0000 mg | INTRAVENOUS | Status: DC
  Administered 2021-04-28: 125 mg via INTRAVENOUS
  Filled 2021-04-28: qty 10

## 2021-05-04 ENCOUNTER — Ambulatory Visit (HOSPITAL_COMMUNITY): Payer: BC Managed Care – PPO

## 2021-05-08 ENCOUNTER — Encounter (HOSPITAL_COMMUNITY)
Admission: RE | Admit: 2021-05-08 | Discharge: 2021-05-08 | Disposition: A | Discharge status: Home / Self Care | Payer: BC Managed Care – PPO | Source: Ambulatory Visit | Attending: Pediatric Gastroenterology | Admitting: Pediatric Gastroenterology

## 2021-05-08 DIAGNOSIS — K50911 Crohn's disease, unspecified, with rectal bleeding: Secondary | ICD-10-CM | POA: Diagnosis present

## 2021-05-08 MED ORDER — SODIUM CHLORIDE 0.9 % IV SOLN
125.0000 mg | INTRAVENOUS | Status: DC
  Administered 2021-05-08: 125 mg via INTRAVENOUS
  Filled 2021-05-08: qty 10

## 2021-05-11 ENCOUNTER — Encounter (HOSPITAL_BASED_OUTPATIENT_CLINIC_OR_DEPARTMENT_OTHER): Payer: Self-pay

## 2021-05-11 ENCOUNTER — Other Ambulatory Visit: Payer: Self-pay

## 2021-05-11 ENCOUNTER — Emergency Department (HOSPITAL_BASED_OUTPATIENT_CLINIC_OR_DEPARTMENT_OTHER)
Admission: EM | Admit: 2021-05-11 | Discharge: 2021-05-11 | Disposition: A | Discharge status: Home / Self Care | Payer: BC Managed Care – PPO | Attending: Emergency Medicine | Admitting: Emergency Medicine

## 2021-05-11 DIAGNOSIS — K623 Rectal prolapse: Secondary | ICD-10-CM | POA: Insufficient documentation

## 2021-05-11 DIAGNOSIS — Z79899 Other long term (current) drug therapy: Secondary | ICD-10-CM | POA: Diagnosis not present

## 2021-05-11 DIAGNOSIS — K6289 Other specified diseases of anus and rectum: Secondary | ICD-10-CM | POA: Diagnosis present

## 2021-05-11 MED ORDER — HYDROMORPHONE HCL 1 MG/ML IJ SOLN
0.5000 mg | Freq: Once | INTRAMUSCULAR | Status: AC
  Administered 2021-05-11: 0.5 mg via INTRAVENOUS
  Filled 2021-05-11: qty 1

## 2021-05-11 MED ORDER — HYDROMORPHONE HCL 1 MG/ML IJ SOLN
1.0000 mg | Freq: Once | INTRAMUSCULAR | Status: AC
  Administered 2021-05-11: 1 mg via INTRAVENOUS
  Filled 2021-05-11: qty 1

## 2021-05-11 NOTE — Discharge Instructions (Signed)
Please follow up closely with your surgeon for further management of your recurrent rectal prolapse.  Avoid imodium as it can worsen your condition.  Return if you have other concerns.

## 2021-05-11 NOTE — ED Notes (Signed)
Dc instructions reviewed with pt no questions or concerns at this time. Pt wheeled out to car and transported home by grandfather

## 2021-05-11 NOTE — ED Provider Notes (Signed)
[place] EMERGENCY DEPT Provider Note   CSN: [id number] Arrival date & time: 05/11/21  1220     History  Chief Complaint  Patient presents with   Rectal Pain    Bradley [name] is a 21 y.o. male.  The history is provided by the patient and medical records. No language interpreter was used.   21 year old male significant history of rectal prolapse, growth hormone deficiency, constipation, solitary rectal ulcer syndrome, Crohn's disease presents to the ED with complaints of rectal pain.  Patient reports last night he experience pain about the rectum consistent with prior rectal prolapse that he has had in the past.  This started shortly after he developed constipation for the past few days after taking Imodium.  He also admits to taking oxycodone for pain.  He is endorsed moderate to severe rectal pain and bloody stool.  He denies having fever chills no nausea vomiting no abdominal pain.  He follows with [facility] for prior surgical fixation of his rectum.  His provider is Vonzella Nipple.  Home Medications Prior to Admission medications   Medication Sig Start Date End Date Taking? Authorizing Provider  albuterol (VENTOLIN HFA) 108 (90 Base) MCG/ACT inhaler Inhale 2 puffs into the lungs every 6 (six) hours as needed for wheezing or shortness of breath. Patient not taking: Reported on 09/05/2020 09/08/18   Kandis Ban, MD  escitalopram (LEXAPRO) 20 MG tablet Take 30 mg by mouth daily.     [provider]  lamoTRIgine (LAMICTAL) 150 MG tablet Take 1 tablet (150 mg total) by mouth 2 (two) times daily. 08/28/17   [name], MD  methylphenidate 27 MG PO TB24  12/14/18   [provider]  predniSONE (DELTASONE) 10 MG tablet 40 mg daily for 5 days 06/27/20   Kandis Ban, MD  traZODone (DESYREL) 50 MG tablet  06/21/18   [provider]      Allergies    Trileptal [oxcarbazepine]    Review of Systems    Review of Systems  All other systems reviewed and are negative.  Physical Exam Updated Vital Signs BP 122/83 (BP Location: Right Arm)    Pulse 87    Temp (!) 97.4 F (36.3 C)    Resp 16    SpO2 100%  Physical Exam Vitals and nursing note reviewed.  Constitutional:      General: He is not in acute distress.    Appearance: He is well-developed.     Comments: Patient appears smaller in stature than stated age  HENT:     Head: Atraumatic.  Eyes:     Conjunctiva/sclera: Conjunctivae normal.  Abdominal:     Palpations: Abdomen is soft.     Tenderness: There is no abdominal tenderness.  Genitourinary:    Comments: Chaperone present during exam.  Evidence of rectal prolapse noted on visual exam with tenderness to palpation.   Musculoskeletal:     Cervical back: Neck supple.  Skin:    Findings: No rash.  Neurological:     Mental Status: He is alert.    ED Results / Procedures / Treatments   Labs (all labs ordered are listed, but only abnormal results are displayed) Labs Reviewed - No data to display  EKG None  Radiology No results found.  Procedures Hernia reduction  Date/Time: 05/11/2021 5:07 PM Performed by: Domenic Moras, PA-C Authorized by: Domenic Moras, PA-C  Consent: Verbal consent obtained. Risks and benefits: risks, benefits and alternatives were discussed Consent given by: patient  Patient understanding: patient states understanding of the procedure being performed Patient consent: the patient's understanding of the procedure matches consent given Patient identity confirmed: verbally with patient and arm band Time out: Immediately prior to procedure a "time out" was called to verify the correct patient, procedure, equipment, support staff and site/side marked as required. Local anesthesia used: no  Anesthesia: Local anesthesia used: no  Sedation: Patient sedated: no  Patient tolerance: patient tolerated the procedure well with no immediate  complications Comments: Chaperone present during exam.  Patient with obvious rectal prolapse which was successfully reduced using application of table sugar to the prolapse for approximately 15 minutes followed by steady pressure applied directly to the prolapse with successful manipulation back to place.      Medications Ordered in ED Medications - No data to display  ED Course/ Medical Decision Making/ A&P                           Medical Decision Making Risk Prescription drug management.   BP 122/83 (BP Location: Right Arm)    Pulse 87    Temp (!) 97.4 F (36.3 C)    Resp 16    SpO2 100%   3:40 PM This is a 21 year old male with significant history of Crohn's disease, previous rectal prolapse requiring fixation by [facility] who presents with complaints of rectal prolapse that started last night.  Endorses sharp severe pain to his rectum with bright red blood in his stools.  This felt very similar to prior rectal prolapse with the most recent episode approximately a month ago when he was last seen in the ED for treatment.  I did review previous ER notes and patient did receive opiate pain medication, table sugar was applied to the exposed mucosa and successful reduction afterward.  I plan to perform the same procedure.  Patient does have an appointment to follow-up with his [facility] in March.  I suspect his rectal prolapse is likely due to recent bouts of constipation after he took Imodium.  At this time patient is resting comfortably, he is afebrile, vital signs stable, no hypoxia, abdomen is soft and nontender, I do not think advanced imaging or labs indicated at this time. Patient does have history of anemia but recently receiving iron infusion.  His rectal bleeding with minimal blood loss.  5:08 PM Chaperone present during exam.  Patient with obvious rectal prolapse which was successfully reduced using application of table sugar to the prolapse for approximately  15 minutes followed by steady pressure applied directly to the prolapse with successful manipulation back to place.  Additional opiate pain medication given per request.  We will continue to monitor.  6:18 PM Patient request to be discharged.  He feels comfortable.  He is stable for discharge.  Recommend outpatient follow-up with his surgeon for further management of his condition.  Return precaution given.        Final Clinical Impression(s) / ED Diagnoses Final diagnoses:  Rectal prolapse    Rx / DC Orders ED Discharge Orders     None         Domenic Moras, PA-C 05/11/21 1819    Davonna Belling, MD 05/12/21 [id number]

## 2021-05-11 NOTE — ED Notes (Signed)
Edp at bedside

## 2021-05-11 NOTE — ED Triage Notes (Signed)
Pt here POV from home with rectal pain.  Pt reports a hx of the same, seen here 2 weeks ago for the same. Pt has a scheduled appointment with Vonzella Nipple from [facility] for surgical consult. Pt had a previous surgery for prolapsed rectum.   Pt denies N/V/D. Pt reports bloody stool in the toilet and toilet paper, light red in color   Pt in NAD, A/O x4

## 2021-05-15 ENCOUNTER — Encounter (HOSPITAL_COMMUNITY)
Admission: RE | Admit: 2021-05-15 | Discharge: 2021-05-15 | Disposition: A | Discharge status: Home / Self Care | Payer: BC Managed Care – PPO | Source: Ambulatory Visit | Attending: Pediatric Gastroenterology | Admitting: Pediatric Gastroenterology

## 2021-05-15 ENCOUNTER — Other Ambulatory Visit: Payer: Self-pay

## 2021-05-15 DIAGNOSIS — K50911 Crohn's disease, unspecified, with rectal bleeding: Secondary | ICD-10-CM | POA: Diagnosis not present

## 2021-05-15 MED ORDER — SODIUM CHLORIDE 0.9 % IV SOLN
125.0000 mg | INTRAVENOUS | Status: DC
  Administered 2021-05-15: 125 mg via INTRAVENOUS
  Filled 2021-05-15: qty 10

## 2021-07-10 ENCOUNTER — Other Ambulatory Visit (INDEPENDENT_AMBULATORY_CARE_PROVIDER_SITE_OTHER): Payer: Self-pay | Admitting: Pediatric Gastroenterology

## 2021-07-10 ENCOUNTER — Encounter (INDEPENDENT_AMBULATORY_CARE_PROVIDER_SITE_OTHER): Payer: Self-pay | Admitting: Pediatric Gastroenterology

## 2021-07-10 DIAGNOSIS — K51011 Ulcerative (chronic) pancolitis with rectal bleeding: Secondary | ICD-10-CM

## 2021-07-10 MED ORDER — XELJANZ XR 11 MG PO TB24: 1.0000 | ORAL_TABLET | Freq: Every day | ORAL | 5 refills | Status: DC

## 2021-07-11 ENCOUNTER — Other Ambulatory Visit (INDEPENDENT_AMBULATORY_CARE_PROVIDER_SITE_OTHER): Payer: Self-pay

## 2021-07-11 DIAGNOSIS — K51011 Ulcerative (chronic) pancolitis with rectal bleeding: Secondary | ICD-10-CM

## 2021-07-11 MED ORDER — XELJANZ XR 11 MG PO TB24: 1.0000 | ORAL_TABLET | Freq: Every day | ORAL | 1 refills | Status: DC

## 2021-07-14 ENCOUNTER — Other Ambulatory Visit (HOSPITAL_COMMUNITY): Payer: Self-pay

## 2021-07-14 MED ORDER — VANCOMYCIN HCL 125 MG PO CAPS
ORAL_CAPSULE | ORAL | 0 refills | Status: DC
  Filled 2021-07-14: qty 40, 10d supply, fill #0

## 2021-07-21 ENCOUNTER — Telehealth (INDEPENDENT_AMBULATORY_CARE_PROVIDER_SITE_OTHER): Payer: Self-pay

## 2021-07-21 NOTE — Telephone Encounter (Signed)
Delayed documentation. Called in Rx to [facility]. Pharmacist stated that she put in the verbal order for Xeljanz XR, take one tablet by mouth daily, quantity of 90, 3 refills. The prescription does have authorization to be changed to generic if needed.  ?

## 2021-07-24 ENCOUNTER — Other Ambulatory Visit (INDEPENDENT_AMBULATORY_CARE_PROVIDER_SITE_OTHER): Payer: Self-pay | Admitting: Pediatric Gastroenterology

## 2021-07-24 ENCOUNTER — Other Ambulatory Visit (INDEPENDENT_AMBULATORY_CARE_PROVIDER_SITE_OTHER): Payer: Self-pay

## 2021-07-24 DIAGNOSIS — K51011 Ulcerative (chronic) pancolitis with rectal bleeding: Secondary | ICD-10-CM

## 2021-07-24 MED ORDER — XELJANZ XR 11 MG PO TB24: 1.0000 | ORAL_TABLET | Freq: Every day | ORAL | 3 refills | Status: DC

## 2021-07-25 ENCOUNTER — Other Ambulatory Visit (INDEPENDENT_AMBULATORY_CARE_PROVIDER_SITE_OTHER): Payer: Self-pay | Admitting: Pediatric Gastroenterology

## 2021-07-25 DIAGNOSIS — K51011 Ulcerative (chronic) pancolitis with rectal bleeding: Secondary | ICD-10-CM

## 2021-12-18 ENCOUNTER — Encounter: Payer: Self-pay | Admitting: Gastroenterology

## 2021-12-18 ENCOUNTER — Telehealth: Payer: Self-pay | Admitting: Gastroenterology

## 2021-12-18 NOTE — Telephone Encounter (Signed)
Supervising MD 12/12/2021 AM          Hi Dr. 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 Patient had Pediatric GI history. Records in Epic for you to review. Please advise on scheduling.      Thank you

## 2021-12-18 NOTE — Telephone Encounter (Signed)
Okay to book for an office visit. Thanks

## 2021-12-18 NOTE — Telephone Encounter (Signed)
Patient is scheduled for 11/27. Thank you

## 2021-12-19 ENCOUNTER — Encounter: Payer: Self-pay | Admitting: Gastroenterology

## 2022-01-31 ENCOUNTER — Encounter (HOSPITAL_COMMUNITY): Payer: Self-pay

## 2022-01-31 ENCOUNTER — Emergency Department (HOSPITAL_COMMUNITY)
Admission: EM | Admit: 2022-01-31 | Discharge: 2022-01-31 | Disposition: A | Discharge status: Home / Self Care | Payer: BC Managed Care – PPO | Attending: Emergency Medicine | Admitting: Emergency Medicine

## 2022-01-31 ENCOUNTER — Other Ambulatory Visit: Payer: Self-pay

## 2022-01-31 DIAGNOSIS — K623 Rectal prolapse: Secondary | ICD-10-CM | POA: Insufficient documentation

## 2022-01-31 MED ORDER — HYDROMORPHONE HCL 1 MG/ML IJ SOLN
0.5000 mg | Freq: Once | INTRAMUSCULAR | Status: AC
  Administered 2022-01-31: 0.5 mg via INTRAVENOUS
  Filled 2022-01-31: qty 1

## 2022-01-31 MED ORDER — DIPHENHYDRAMINE HCL 50 MG/ML IJ SOLN
12.5000 mg | Freq: Once | INTRAMUSCULAR | Status: AC
  Administered 2022-01-31: 12.5 mg via INTRAVENOUS
  Filled 2022-01-31: qty 1

## 2022-01-31 MED ORDER — HYDROMORPHONE HCL 1 MG/ML IJ SOLN
1.0000 mg | Freq: Once | INTRAMUSCULAR | Status: AC
  Administered 2022-01-31: 1 mg via INTRAVENOUS
  Filled 2022-01-31: qty 1

## 2022-01-31 MED ORDER — SODIUM CHLORIDE 0.9 % IV BOLUS
500.0000 mL | Freq: Once | INTRAVENOUS | Status: AC
  Administered 2022-01-31: 500 mL via INTRAVENOUS

## 2022-01-31 NOTE — ED Provider Notes (Signed)
[facility] EMERGENCY DEPARTMENT Provider Note   CSN: [id number] Arrival date & time: 01/31/22  1809     History  Chief Complaint  Patient presents with   Rectal Prolapse    Bradley [name] is a 21 y.o. male.  Patient complains of rectal prolapse.  Patient reports he has a history of similar episodes.  Patient reports he has had reduction of prolapse approximately 6 times this year.  Patient reports a prolapse is usually can be reduced with sugar and general pressure.  Patient reports the episodes are very painful.  Patient reports he can normally tolerate the procedure if he has pain medicine before.  The history is provided by the patient. No language interpreter was used.       Home Medications Prior to Admission medications   Medication Sig Start Date End Date Taking? Authorizing Provider  escitalopram (LEXAPRO) 20 MG tablet Take 30 mg by mouth daily.     [provider]  ferrous sulfate 325 (65 FE) MG tablet Take 325 mg by mouth daily with breakfast.    [provider]  gabapentin (NEURONTIN) 300 MG capsule Take 300 mg by mouth 3 (three) times daily. 04/10/21   [provider]  lamoTRIgine (LAMICTAL) 150 MG tablet Take 1 tablet (150 mg total) by mouth 2 (two) times daily. 08/28/17   [name], MD  methylphenidate 27 MG PO CR tablet Take 27 mg by mouth every morning. 04/19/21   [provider]  oxyCODONE (OXY IR/ROXICODONE) 5 MG immediate release tablet Take 5 mg by mouth every 4 (four) hours as needed for severe pain.    [provider]  Pediatric Multiple Vitamins (CHEWABLE VITE CHILDRENS) CHEW Chew 1 tablet by mouth daily. 03/15/21   [provider]  traZODone (DESYREL) 50 MG tablet  06/21/18   [provider]  vancomycin (VANCOCIN) 125 MG capsule Take 1 capsule (125 mg total) by mouth every six (6) hours for 10 days. 07/14/21     XELJANZ XR 11 MG TB24 TAKE 1 TABLET BY MOUTH DAILY. 07/26/21    Kandis Ban, MD      Allergies    Trileptal [oxcarbazepine]    Review of Systems   Review of Systems  All other systems reviewed and are negative.   Physical Exam Updated Vital Signs BP 123/85   Pulse 96   Temp 98.4 F (36.9 C) (Oral)   Resp 12   Ht 5' 3.5" (1.613 m)   Wt 49.9 kg   SpO2 98%   BMI 19.18 kg/m  Physical Exam Vitals reviewed.  Constitutional:      Appearance: Normal appearance.  HENT:     Mouth/Throat:     Mouth: Mucous membranes are moist.  Eyes:     Pupils: Pupils are equal, round, and reactive to light.  Cardiovascular:     Rate and Rhythm: Normal rate and regular rhythm.  Pulmonary:     Effort: Pulmonary effort is normal.  Genitourinary:    Comments: Approx 2cm area of prolapse Musculoskeletal:        General: Normal range of motion.  Skin:    General: Skin is warm.  Neurological:     General: No focal deficit present.     Mental Status: He is alert.  Psychiatric:        Mood and Affect: Mood normal.     ED Results / Procedures / Treatments   Labs (all labs ordered are listed, but only abnormal results are  displayed) Labs Reviewed - No data to display  EKG None  Radiology No results found.  Procedures Procedures    Medications Ordered in ED Medications  sodium chloride 0.9 % bolus 500 mL (0 mLs Intravenous Stopped 01/31/22 1952)  HYDROmorphone (DILAUDID) injection 1 mg (1 mg Intravenous Given 01/31/22 1902)  HYDROmorphone (DILAUDID) injection 1 mg (1 mg Intravenous Given 01/31/22 2011)  HYDROmorphone (DILAUDID) injection 0.5 mg (0.5 mg Intravenous Given 01/31/22 2042)    ED Course/ Medical Decision Making/ A&P                           Medical Decision Making Pt complains of a prolapsed rectum.  Pt has had in the past   Amount and/or Complexity of Data Reviewed Independent Historian: parent    Details: Pt here with parents who are supportive   Risk Parenteral controlled substances. Risk Details: See  procedure note   Procedure.  Pt given dilaudid 1 mg IV prior to procedure.  Pt placed on right side,  granulated sugar applied to area.  Area reduced with gentle pressure.  Pt had minimal discomfort and requested additional medication.  Pt given dilaudid .05.  Pt reports itching after medication.  No rash  no shortness of breath.  Pt given benadryl 12.5 mg IV.           Final Clinical Impression(s) / ED Diagnoses Final diagnoses:  Rectal prolapse    Rx / DC Orders ED Discharge Orders     None      An After Visit Summary was printed and given to the patient.    Sidney Ace 01/31/22 2125    Godfrey Pick, MD 02/01/22 [phone]

## 2022-01-31 NOTE — ED Triage Notes (Signed)
Pt reports he has a rectal prolapse associated with bleeding and pain, onset last night. Hx of rectal prolapse. Denies abd pain/fevers/chills.

## 2022-01-31 NOTE — Discharge Instructions (Addendum)
Follow up with your Physician for recheck

## 2022-02-26 ENCOUNTER — Ambulatory Visit (INDEPENDENT_AMBULATORY_CARE_PROVIDER_SITE_OTHER): Payer: BC Managed Care – PPO | Admitting: Gastroenterology

## 2022-02-26 ENCOUNTER — Other Ambulatory Visit: Payer: BC Managed Care – PPO

## 2022-02-26 ENCOUNTER — Other Ambulatory Visit: Payer: Self-pay

## 2022-02-26 ENCOUNTER — Encounter: Payer: Self-pay | Admitting: Gastroenterology

## 2022-02-26 ENCOUNTER — Telehealth: Payer: Self-pay | Admitting: Pharmacy Technician

## 2022-02-26 VITALS — BP 112/76 | HR 112 | Ht 63.5 in | Wt 111.0 lb

## 2022-02-26 DIAGNOSIS — K5100 Ulcerative (chronic) pancolitis without complications: Secondary | ICD-10-CM

## 2022-02-26 DIAGNOSIS — K529 Noninfective gastroenteritis and colitis, unspecified: Secondary | ICD-10-CM

## 2022-02-26 DIAGNOSIS — K602 Anal fissure, unspecified: Secondary | ICD-10-CM | POA: Diagnosis not present

## 2022-02-26 DIAGNOSIS — K5909 Other constipation: Secondary | ICD-10-CM

## 2022-02-26 MED ORDER — AMBULATORY NON FORMULARY MEDICATION: 0 refills | Status: DC

## 2022-02-26 MED ORDER — POLYETHYLENE GLYCOL 3350 17 G PO PACK: 17.0000 g | PACK | Freq: Every day | ORAL | 0 refills | Status: DC

## 2022-02-26 NOTE — Telephone Encounter (Signed)
Dr. Havery Moros,  Weyman Rodney will be denied with DX code K52.9.  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  Please update chart notes with DX code K51,  if you would like to proceed with Entyvio. Once updated I will be able to submit authorization with correct code.   Auth Submission: PENDING Payer: BCBS Medication & CPT/J Code(s) submitted: Entyvio (Vedolizumab) [id number] Route of submission (phone, fax, portal): COVER MY MEDS Phone # Fax # Auth type: Buy/Bill Units/visits requested: 2400 UNITS - 8 VISITS Reference number: [id number] Approval from:  to  at [place]

## 2022-02-26 NOTE — Telephone Encounter (Signed)
He definitely has IBD, hard to say definitively Crohn's vs. UC, however last colonoscopy was most consistent with ulcerative colitis so I think okay to use that code. Thank you

## 2022-02-26 NOTE — Patient Instructions (Addendum)
If you are age 23 or older, your body mass index should be between 23-30. Your Body mass index is 19.35 kg/m. If this is out of the aforementioned range listed, please consider follow up with your Primary Care Provider.  If you are age 43 or younger, your body mass index should be between 19-25. Your Body mass index is 19.35 kg/m. If this is out of the aformentioned range listed, please consider follow up with your Primary Care Provider.   ________________________________________________________  Please go to the lab in the basement of our building to have lab work done as you leave today. Hit "B" for basement when you get on the elevator.  When the doors open the lab is on your left.  We will call you with the results. Thank you.  Stop Morrie Sheldon.  We will start you on Entyvio once we check with your insurance. You will be contacted by [facility], Dr. Doyne Keel nurse.  Please purchase the following medications over the counter and take as directed: Miralax: Take once daily and titrate as needed _______________________________________________________________________ We have sent a prescription for Diltiazem with 5% lidocaine gel to [facility]. You should apply a pea size amount to your rectum three times daily as needed   [facility] information is below: Address: [address] [place], [place] [address]  [phone]  *Please DO NOT go directly from our office to pick up this medication! Give the pharmacy 1 day to process the prescription as this is compounded and takes time to make.   ____________________________________________________________________  Dennis Bast have been scheduled for a follow up appointment with Dr. Havery Moros on Tuesday, 1-23 at 10:10am. Please arrive 10 minutes early for registration.   You will be contacted by [facility] in the next 2 days to arrange an MRI Pelvis.  The number on your caller ID will be [phone], please  answer when they call.  If you have not heard from them in 2 days please call [phone] to schedule.      Thank you for entrusting me with your care and for choosing [facility], Dr.  Cellar

## 2022-02-26 NOTE — Progress Notes (Signed)
HPI :  21 year old male with a history of inflammatory bowel disease (ulcerative colitis versus Crohn's), chronic rectal pain, history of anal fissure, history of rectal prolapse, referred by Alfredo Batty MD for longer-term management of his inflammatory bowel disease.  History obtained from the patient, his parents who are at the visit today, and extensive review of his chart.  Chronologically, the patient has had problems with his bowels dating back to 2012 or so.  Sounds like he has had constipation with occasional diarrhea, was told he was lactose intolerant initially.  He states he was remotely seen by pediatric GI and was told he did not have celiac disease and was simply gluten sensitive.  He has celiac serologies from 2012 that are negative.  It sounds like his symptoms persisted over time and eventually established with Dr. Alease Frame, pediatric GI in [place] back in 2016 or 17.  He underwent a colonoscopy that I reviewed from September 2017 and it appeared to show patchy left-sided colitis although the prep was poor.  Biopsies from that time showed focal active colitis of the rectum with normal biopsies of the left colon otherwise.  Biopsies of the small bowel were negative.  His fecal calprotectin at that time was over 2000. It sounds like he was put on Humira initially as well as some other supplementation.  Parents and patient states that Humira never provided any benefit, it sounds like he was a nonresponder.  They do report that over time he did lose weight and was admitted for failure to thrive, required 1 weeks worth of NG feeding tubes back in 2017.  Over time, Dr. Alease Frame left the area, the patient transferred his care to Dr. Yehuda Savannah of pediatric [facility] back in 2018.  They report that initially when they saw him he was taken off Humira and they question the diagnosis of Crohn's disease.  He was given budesonide and then transition to prednisone.  They endorse having numerous  courses of prednisone over the past several years.  It sounds like prednisone helped him when he took it but was never in clinical remission and was missing a lot of school due to these issues.  He is also had short stature and poor growth dating back to age 46 and has been on growth hormone in the past.  In 2019, it sounds like she had severe diarrhea (baseline symptoms from colitis have been constipation).  Tested positive for C. difficile and was treated for that with improvement of symptoms.  Over time continue to have symptoms of constipation, rectal bleeding, rectal pain and developed rectal prolapse.  He had surgery for rectal prolapse after failing therapy with pelvic floor physical therapy, Botox etc., done by Dr. Hardin Negus within the past year or so it sounds like.  Unfortunately despite surgery for rectal prolapse he has had some recurrence of this.  He was seen in the ED in January, February, and November for prolapse that required manual reduction.  They report that he essentially has had significant symptoms of constipation associated with rectal bleeding, fecal leakage, rectal pain is the main symptoms that bother him.  He is currently taking stool softeners and Benefiber which has not sounded like its provide good treatment for his constipation.  He still has 1 bowel movement every several days and it is hard to pass stool without significant discomfort and pain in his rectum which can last for some time.  He has been told he had anal fissure in the past, has  been using topical nifedipine ointment however has not been placing it PR, has been only applying superficially and intermittently at most.  Has been given some gabapentin for treatment of his rectal pain which she states can help at times, but this is never been well controlled.  I see narcotics on his medication list and he states he does not take any narcotics at baseline.  In regards to therapy for his IBD, Humira and steroids he has  taken historically and that has been on [place] for the past year.  I also see a note regarding Apriso use in the past although it sounds like he was not on this longstanding.  His father states that he thought he was doing much better on Morrie Sheldon initially, but then seems to have lost response, despite dose adjustment he continues to have his symptoms that bother him.  He had a colonoscopy last month with Dr. Yehuda Savannah which showed left-sided active colitis continuous from the anal verge to the left colon.  Looks like he had an MRE in 2017 which showed no small bowel involvement, though this was a suboptimal study.  Subjectively the patient's main symptoms that bother him or rectal discomfort with bleeding, constipation and leakage.  It does not sound like he has been on steroids recently since being on [place].  Currently works at [employer].  He reports a history of psoriasis but states it is currently in remission and not really bothering him.  His grandfather had Crohn's/colitis.  He has never been on Remicade, Stelara, Weyman Rodney, or other biologic therapies.  Note he is also had a history of iron deficiency.  Labs in care everywhere reviewed from October of this past year show a hemoglobin of 13.8, MCV of 87.7.  ESR of 14, CRP undetectable.  Liver function and renal function normal.  Colonoscopy 09/30/20 - active left sided colitis on biopsies- moderate  Colonoscopy 01/18/2022 - Dr. Yehuda Savannah - active inflammation from anal margin to 20cm from anal verge - May score 1, improved from prior exam reportedly. Normal ileum, cecal patch noted. Poor prep noted.  Path shows normal right / transverse colon, mildly active left sided disease     Past Medical History:  Diagnosis Date   ADHD (attention deficit hyperactivity disorder)    Anal fissure    Anemia    Anxiety    Chronic constipation    Depression    Encopresis(307.7)    Growth hormone deficiency (HCC)    GSE (gluten-sensitive  enteropathy)    Inflammatory bowel disease    Lactose intolerance    Mood disorder ([place])    Psoriasis    Scoliosis    Short stature    Solitary rectal ulcer syndrome    Ulcerative pancolitis with other complication [facility])      Past Surgical History:  Procedure Laterality Date   ADENOIDECTOMY     COLONOSCOPY N/A 12/27/2015   Procedure: COLONOSCOPY;  Surgeon: Joycelyn Rua, MD;  Location: [place];  Service: Gastroenterology;  Laterality: N/A;   ESOPHAGOGASTRODUODENOSCOPY N/A 12/27/2015   Procedure: ESOPHAGOGASTRODUODENOSCOPY (EGD);  Surgeon: Joycelyn Rua, MD;  Location: [place];  Service: Gastroenterology;  Laterality: N/A;   ESOPHAGOGASTRODUODENOSCOPY N/A 08/03/2016   Procedure: ESOPHAGOGASTRODUODENOSCOPY (EGD);  Surgeon: Joycelyn Rua, MD;  Location: [place];  Service: Gastroenterology;  Laterality: N/A;   FLEXIBLE SIGMOIDOSCOPY  08/03/2016   Procedure: FLEXIBLE SIGMOIDOSCOPY;  Surgeon: Joycelyn Rua, MD;  Location: [facility] ENDOSCOPY;  Service: Gastroenterology;;   RECTOPEXY  01/19/2021   laproscopy, rectopexy for Prolapse, sigmoidoscopy,  flex  ( Dr. Alain Marion)   tubes and adenoids     Family History  Problem Relation Age of Onset   Depression Mother    Miscarriages / [place] Mother    Hyperlipidemia Maternal Grandmother    Depression Maternal Grandmother    Cancer Maternal Grandfather        prostate   Hyperlipidemia Maternal Grandfather    Hypertension Maternal Grandfather    Ulcerative colitis Maternal Grandfather    Hyperlipidemia Paternal Grandmother    Thyroid disease Paternal Grandmother    Cancer Paternal Grandmother    Diabetes Paternal Grandfather    Hyperlipidemia Paternal Grandfather    Cancer Paternal Grandfather    Thyroid disease Paternal Grandfather    Depression Maternal Aunt    Celiac disease Neg Hx    Hirschsprung's disease Neg Hx    Social History   Tobacco Use   Smoking status: Never   Smokeless tobacco: Never   Vaping Use   Vaping Use: Never used  Substance Use Topics   Alcohol use: No   Drug use: No   Current Outpatient Medications  Medication Sig Dispense Refill   AMBULATORY NON FORMULARY MEDICATION Diltiazem gel with 5% lidocaine  Apply a pea sized amount into your rectum three times daily as needed Dispense 30 GM zero refill 30 g 0   escitalopram (LEXAPRO) 20 MG tablet Take 30 mg by mouth daily.      ferrous sulfate 325 (65 FE) MG tablet Take 325 mg by mouth daily with breakfast.     gabapentin (NEURONTIN) 300 MG capsule Take 300 mg by mouth 3 (three) times daily.     lamoTRIgine (LAMICTAL) 150 MG tablet Take 1 tablet (150 mg total) by mouth 2 (two) times daily. 60 tablet 1   lisdexamfetamine (VYVANSE) 50 MG capsule Take 50 mg by mouth daily.     NON FORMULARY Stool softner     Pediatric Multiple Vitamins (CHEWABLE VITE CHILDRENS) CHEW Chew 1 tablet by mouth daily.     polyethylene glycol (MIRALAX) 17 g packet Take 17 g by mouth daily. Titrate as needed 14 each 0   traZODone (DESYREL) 50 MG tablet      Wheat Dextrin (BENEFIBER PO) Take by mouth.     methylphenidate 27 MG PO CR tablet Take 27 mg by mouth every morning.     oxyCODONE (OXY IR/ROXICODONE) 5 MG immediate release tablet Take 5 mg by mouth every 4 (four) hours as needed for severe pain.     vancomycin (VANCOCIN) 125 MG capsule Take 1 capsule (125 mg total) by mouth every six (6) hours for 10 days. 40 capsule 0   No current facility-administered medications for this visit.   Allergies  Allergen Reactions   Trileptal [Oxcarbazepine] Rash     Review of Systems: All systems reviewed and negative except where noted in HPI.   Labs reviewed in care everywhere, listed in HPI as above.  Physical Exam: BP 112/76   Pulse (!) 112   Ht 5' 3.5" (1.613 m)   Wt 111 lb (50.3 kg)   BMI 19.35 kg/m  Constitutional: Pleasant, male in no acute distress. HEENT: Normocephalic and atraumatic. Conjunctivae are normal. No scleral  icterus. Neck supple.  Cardiovascular: mild tachy, regular rhythm.  Pulmonary/chest: Effort normal and breath sounds normal.  Abdominal: Soft, nondistended, nontender.  There are no masses palpable. No hepatomegaly. Perianal exam - limited due to pain, full DRE not performed, but posterior midline anal fissure noted - CMA Jan  Hogan as standby Extremities: no edema Lymphadenopathy: No cervical adenopathy noted. Neurological: Alert and oriented to person place and time. Skin: Skin is warm and dry. No rashes noted. Psychiatric: Normal mood and affect. Behavior is normal.   ASSESSMENT: 21 y.o. male here for assessment of the following  1. Inflammatory bowel disease   2. Anal fissure   3. Other constipation    Extensive history as outlined above, taken from review of his records and discussion with the patient and his parents today.  He clearly has inflammatory bowel disease, initial endoscopic appearance was more so concerning for Crohn's disease however his most recent exam was more consistent with ulcerative colitis.  He has severe perianal pain which seems to correlate with posterior midline anal fissure noted today.  I do not see any obvious perianal fistula on his exam however perianal Crohn's remains on the differential and I think an MRI of his pelvis to ensure no underlying fistulas is reasonable given his chronic pain.  Had a long discussion with the patient and his parents about his history and course, and plans moving forward.  We need to better manage his constipation as this is likely driving his anal fissures and making prolapse worse.  He has not tried routine use of MiraLAX, his current regimen is not working well, recommend MiraLAX every day and titrate up as needed for goal of 1 bowel movement every day to every other day.  He needs to minimize amount of time spent on the toilet and straining to help reduce his risk for recurrent prolapse.  For the anal fissure recommend topical  diltiazem/lidocaine ointment, applies pea-sized amount PR 3 times daily, counseled him this can take weeks to months to heal but needs to be used rectally, not superficially.  If he has a hard time doing this he can apply using glycerin suppositories.  He is agreeable to try this.  Finally, we discussed management of his IBD.  Sounds like he was a primary nonresponder to Humira, mesalamine did not work, has been on multiple courses of steroids until this past year he was placed on [place].  It sounds like Morrie Sheldon has provided some benefit but is not remission despite maintenance dosing, with active symptoms and disease endoscopically.  Given these findings I think we should switch to a different biologic therapy.  I discussed several options with them today.  We could consider Remicade combined with thiopurines although this regimen carries the most risks, and given he did not respond at all to Humira, question if he will respond to Remicade.  Other options to consider would be Entyvio, Stelara, Rinvoq, and potentially [name] Ape (depending on if this is thought to be Crohn's or not - not yet approved for UC but this may happen soon), versus one of the newer smaller molecule oral options.  Given his age and safety profile, and that he wants to avoid injection therapy, I think Weyman Rodney is a reasonable next option.  I will send request for this to the infusion center to see if his insurance will approve this or not.  We will send him to the lab today for QuantiFERON gold, as well as fecal calprotectin to get baseline prior to starting a different therapy.  Hopefully if approved, we can start this in the next 1 to 2 weeks.  I will await results of his labs, and MRI pelvis with further recommendation.  He will need follow-up in the upcoming 1 to 2 months for reassessment and further discussion  of long-term plans pending his course.  All questions answered, they agree with the plan as outlined.   PLAN: - stop  Morrie Sheldon - if approved by insurance, will hope to start Entyvio in the near future, orders placed - lab today - quantiferon gold, fecal calprotectin - start Miralax daily - titrate up as needed for goal BM once every other day to daily - diltiazen / lidocaine ointment - pea sized amount PR TID, can use glycerin suppository PRN - MRI pelvis with contrast - rule out perianal fistula / Crohn's disease - follow up sometime in January or sooner with issues  I spent 70 minutes of time, including in depth chart review, face-to-face time with the patient, coordinating care, and documentation of this encounter.     Jolly Mango, MD [facility]

## 2022-02-27 ENCOUNTER — Encounter: Payer: Self-pay | Admitting: Gastroenterology

## 2022-02-27 DIAGNOSIS — K5100 Ulcerative (chronic) pancolitis without complications: Secondary | ICD-10-CM | POA: Insufficient documentation

## 2022-03-01 ENCOUNTER — Encounter: Payer: Self-pay | Admitting: Gastroenterology

## 2022-03-01 LAB — QUANTIFERON-TB GOLD PLUS
Mitogen-NIL: 7.24 IU/mL
NIL: 0.04 IU/mL
QuantiFERON-TB Gold Plus: NEGATIVE
TB1-NIL: <0 IU/mL
TB2-NIL: <0 IU/mL

## 2022-03-02 NOTE — Telephone Encounter (Signed)
Noted, thank you

## 2022-03-02 NOTE — Telephone Encounter (Signed)
[name], I just wanted to follow up on the status of authorization for Entyvio. Thanks

## 2022-03-02 NOTE — Telephone Encounter (Signed)
[name], His Bradley [name] has been approved.  Scheduling team was unsuccessful in reaching out to him. V/m was left.  They will attempt to reach out again shortly.

## 2022-03-02 NOTE — Telephone Encounter (Signed)
Thank you. If he does not respond to your phone calls, you may want to contact his parents to scheduling the entyvio. thanks

## 2022-03-09 ENCOUNTER — Ambulatory Visit: Payer: BC Managed Care – PPO

## 2022-03-13 ENCOUNTER — Telehealth: Payer: Self-pay | Admitting: Gastroenterology

## 2022-03-13 NOTE — Telephone Encounter (Signed)
Bradley [name] from [facility] called requesting to speak with you [phone].

## 2022-03-13 NOTE — Telephone Encounter (Signed)
Bradley [name] from MRI is calling requesting a call back before end of the day. Please advise

## 2022-03-13 NOTE — Telephone Encounter (Signed)
(  [name] from the MRI department called for clarification of MRI pelvis order. Spoke with Dr. Havery Moros he only wants MRI pelvis to rule out perianal fistula, not so much IBD. I provided Bradley [name] with this information.

## 2022-03-13 NOTE — Telephone Encounter (Signed)
Error

## 2022-03-14 ENCOUNTER — Ambulatory Visit (HOSPITAL_COMMUNITY)
Admission: RE | Admit: 2022-03-14 | Discharge: 2022-03-14 | Disposition: A | Discharge status: Home / Self Care | Payer: BC Managed Care – PPO | Source: Ambulatory Visit | Attending: Gastroenterology | Admitting: Gastroenterology

## 2022-03-14 DIAGNOSIS — K529 Noninfective gastroenteritis and colitis, unspecified: Secondary | ICD-10-CM | POA: Insufficient documentation

## 2022-03-14 DIAGNOSIS — K602 Anal fissure, unspecified: Secondary | ICD-10-CM | POA: Diagnosis present

## 2022-03-14 MED ORDER — GADOBUTROL 1 MMOL/ML IV SOLN
6.0000 mL | Freq: Once | INTRAVENOUS | Status: AC | PRN
  Administered 2022-03-14: 5 mL via INTRAVENOUS

## 2022-03-16 ENCOUNTER — Encounter: Payer: Self-pay | Admitting: Gastroenterology

## 2022-03-16 ENCOUNTER — Other Ambulatory Visit: Payer: BC Managed Care – PPO

## 2022-03-16 DIAGNOSIS — K529 Noninfective gastroenteritis and colitis, unspecified: Secondary | ICD-10-CM

## 2022-03-16 DIAGNOSIS — K602 Anal fissure, unspecified: Secondary | ICD-10-CM

## 2022-03-16 DIAGNOSIS — K5909 Other constipation: Secondary | ICD-10-CM

## 2022-03-19 NOTE — Telephone Encounter (Signed)
Dr. Havery Moros, patient's father would like patient to transition to sub-q maintenance doses after he completes the necessary infusions. I would need the sub-q injection dosing information so that PA can be initiated. Please advise, thanks.

## 2022-03-19 NOTE — Telephone Encounter (Signed)
Inbound call from [patient dad wanting to change prescription to at home injections. Please advise.

## 2022-03-20 ENCOUNTER — Telehealth: Payer: Self-pay

## 2022-03-20 NOTE — Telephone Encounter (Signed)
Subcutaneous maintenance dose would begin at week 6, after first 2 infusions. I sent note to PA team to initiate PA for this. I called patient's father Loletha Grayer) and let him know the information. He states that he is not sure if patient will qualify for sub-q injections or not. I told him that we will have more information once we hear back from PA team. If denied patient will need to continue IV infusions. Pt's father wanted to know how much the infusions would cost, I told him that I did not know and it looks like the infusion center got him approved for [facility] co-pay card. I provided Loletha Grayer with the # to the infusion center to further inquire about infusion cost for patient. Loletha Grayer verbalized understanding and had no concerns at the end of the call.  See alternate phone note for details regarding PA for maintenance sub-q dose

## 2022-03-20 NOTE — Telephone Encounter (Signed)
Yes that is fine. Bradley [name] has a new subcutaneous option. I am fine to transition him to that if that is his preference. Thanks

## 2022-03-20 NOTE — Telephone Encounter (Signed)
PA TEAM:  Please initiate PA for Entyvio pen 108 mg 1-single-dose prefilled pen every 2 weeks  Dispense: #2 pens

## 2022-03-21 ENCOUNTER — Ambulatory Visit (INDEPENDENT_AMBULATORY_CARE_PROVIDER_SITE_OTHER): Payer: BC Managed Care – PPO

## 2022-03-21 VITALS — BP 107/69 | HR 93 | Temp 98.0°F | Resp 16 | Ht 63.0 in | Wt 107.0 lb

## 2022-03-21 DIAGNOSIS — K5100 Ulcerative (chronic) pancolitis without complications: Secondary | ICD-10-CM | POA: Diagnosis not present

## 2022-03-21 MED ORDER — VEDOLIZUMAB 300 MG IV SOLR
300.0000 mg | Freq: Once | INTRAVENOUS | Status: AC
  Administered 2022-03-21: 300 mg via INTRAVENOUS
  Filled 2022-03-21: qty 5

## 2022-03-21 NOTE — Progress Notes (Signed)
Diagnosis: Ulcerative Colitis  Provider:  Marshell Garfinkel MD  Procedure: Infusion  IV Type: Peripheral, IV Location: L Forearm  Entyvio (Vedolizumab), Dose: 300 mg  Infusion Start Time: 8006  Infusion Stop Time: 1007  Post Infusion IV Care: Observation period completed and Peripheral IV Discontinued  Discharge: Condition: Good, Destination: Home . AVS provided to patient.   Performed by:  Arnoldo Morale, RN

## 2022-03-22 LAB — CALPROTECTIN, FECAL: Calprotectin, Fecal: 3660 ug/g — ABNORMAL HIGH (ref 0–120)

## 2022-03-23 ENCOUNTER — Other Ambulatory Visit (HOSPITAL_COMMUNITY): Payer: Self-pay

## 2022-03-23 NOTE — Telephone Encounter (Signed)
PA has been submitted.

## 2022-03-23 NOTE — Telephone Encounter (Signed)
Patient Advocate Encounter  Received notification from [facility] that prior authorization for ENTYVIO 108MG is required.   PA submitted on 12.22.23 Key [id number] Status is pending

## 2022-03-29 ENCOUNTER — Telehealth: Payer: Self-pay | Admitting: Pharmacy Technician

## 2022-03-29 NOTE — Telephone Encounter (Signed)
Received fax from [facility]. PA for [facility] was denied. Called and informed patients father. Bradley [name] has been advised that patient will need to continue original Entyvio infusion treatment plan. Bradley [name] verbalized understanding and had no concerns at the end of the call.  [place], I am faxing you the denial so that it can be scanned into patient's chart. Thanks

## 2022-03-29 NOTE — Telephone Encounter (Signed)
Received a fax regarding Prior Authorization from [place] for ENTYVIO 108MG. Authorization has been DENIED because .

## 2022-03-29 NOTE — Telephone Encounter (Signed)
Noted, see alternate telephone encounter for details. Pt will continue Entyvio infusions.

## 2022-03-30 NOTE — Telephone Encounter (Signed)
Noted, thanks!

## 2022-03-30 NOTE — Telephone Encounter (Signed)
Denial letter has been scanned in.

## 2022-04-03 ENCOUNTER — Encounter: Payer: Self-pay | Admitting: Gastroenterology

## 2022-04-04 ENCOUNTER — Encounter: Payer: Self-pay | Admitting: Gastroenterology

## 2022-04-05 ENCOUNTER — Encounter: Payer: Self-pay | Admitting: Gastroenterology

## 2022-04-05 ENCOUNTER — Ambulatory Visit (INDEPENDENT_AMBULATORY_CARE_PROVIDER_SITE_OTHER): Payer: No Typology Code available for payment source

## 2022-04-05 VITALS — BP 127/83 | HR 92 | Temp 97.9°F | Resp 16 | Ht 63.5 in | Wt 105.2 lb

## 2022-04-05 DIAGNOSIS — K5100 Ulcerative (chronic) pancolitis without complications: Secondary | ICD-10-CM

## 2022-04-05 MED ORDER — VEDOLIZUMAB 300 MG IV SOLR
300.0000 mg | Freq: Once | INTRAVENOUS | Status: AC
  Administered 2022-04-05: 300 mg via INTRAVENOUS
  Filled 2022-04-05: qty 5

## 2022-04-05 NOTE — Progress Notes (Signed)
Diagnosis: Infusion  Provider:  Marshell Garfinkel MD  Procedure: Infusion  IV Type: Peripheral, IV Location: L Antecubital  Entyvio (Vedolizumab), Dose: 300 mg  Infusion Start Time: 2536  Infusion Stop Time: 1200  Post Infusion IV Care: Peripheral IV Discontinued  Discharge: Condition: Good, Destination: Home . AVS provided to patient.   Performed by:  Adelina Mings, LPN

## 2022-04-11 ENCOUNTER — Encounter: Payer: Self-pay | Admitting: Gastroenterology

## 2022-04-16 ENCOUNTER — Telehealth: Payer: Self-pay

## 2022-04-16 MED ORDER — METRONIDAZOLE 250 MG PO TABS: 250.0000 mg | ORAL_TABLET | Freq: Three times a day (TID) | ORAL | 0 refills | Status: DC

## 2022-04-16 NOTE — Telephone Encounter (Signed)
Attempted to reach patient twice - Lm on vm for patient to return call.  Flagyl prescription sent to requested pharmacy.

## 2022-04-16 NOTE — Addendum Note (Signed)
Addended by: Yevette Edwards on: 04/16/2022 04:17 PM   Modules accepted: Orders

## 2022-04-16 NOTE — Telephone Encounter (Signed)
Pt called into the office today. He states that his symptoms are getting worse. He had a flare up and has been having constant rectal pain. Pt states that the pain is so bad that he can't use the rectal cream or suppository. Patient states that his last BM was about 3 days ago. Pt is interested in trying Flagyl that was previously recommended. Pt would like RX sent to [facility] at [facility]. Please advise, thanks.

## 2022-04-16 NOTE — Telephone Encounter (Signed)
Sorry to hear this.  Yes he should definitely go on the Flagyl with these symptoms  - 250mg  TID for 2 weeks to start.  I will be seeing him next week in the office for his routine follow-up.  He should be using the diltiazem ointment otherwise.  May need to see colorectal surgery pending his course and findings.  If fevers or severe worsening that Flagyl is not helping he needs to let us know.  Unfortunately I am not in the office available to see him this week as I am covering the hospital.  If he needs to be seen sooner this week due to worsening then one of my colleagues or APP's will need to assist.

## 2022-04-17 NOTE — Telephone Encounter (Signed)
Lm on vm for patient to return call. I also informed patient that I will send recommendations to his MyChart as well.

## 2022-04-17 NOTE — Telephone Encounter (Signed)
MyChart message reviewed by patient. Last read by [name] at  3:20 PM on 04/17/2022.

## 2022-04-18 ENCOUNTER — Telehealth: Payer: Self-pay

## 2022-04-18 NOTE — Telephone Encounter (Signed)
Received a fax from [place] for new prescription request for Entyvio.  This form has been completed and [facility]ed [facility] at [phone].

## 2022-04-24 ENCOUNTER — Encounter: Payer: Self-pay | Admitting: Gastroenterology

## 2022-04-24 ENCOUNTER — Ambulatory Visit (INDEPENDENT_AMBULATORY_CARE_PROVIDER_SITE_OTHER): Payer: No Typology Code available for payment source | Admitting: Gastroenterology

## 2022-04-24 ENCOUNTER — Other Ambulatory Visit (INDEPENDENT_AMBULATORY_CARE_PROVIDER_SITE_OTHER): Payer: No Typology Code available for payment source

## 2022-04-24 VITALS — BP 104/74 | HR 126 | Ht 63.5 in | Wt 107.1 lb

## 2022-04-24 DIAGNOSIS — K602 Anal fissure, unspecified: Secondary | ICD-10-CM | POA: Diagnosis not present

## 2022-04-24 DIAGNOSIS — K603 Anal fistula: Secondary | ICD-10-CM

## 2022-04-24 DIAGNOSIS — K50118 Crohn's disease of large intestine with other complication: Secondary | ICD-10-CM | POA: Diagnosis not present

## 2022-04-24 LAB — CBC WITH DIFFERENTIAL/PLATELET
Basophils Absolute: 0 10*3/uL (ref 0.0–0.1)
Basophils Relative: 0.5 % (ref 0.0–3.0)
Eosinophils Absolute: 0.2 10*3/uL (ref 0.0–0.7)
Eosinophils Relative: 2.3 % (ref 0.0–5.0)
HCT: 42.6 % (ref 39.0–52.0)
Hemoglobin: 14.4 g/dL (ref 13.0–17.0)
Lymphocytes Relative: 21.1 % (ref 12.0–46.0)
Lymphs Abs: 2 10*3/uL (ref 0.7–4.0)
MCHC: 33.8 g/dL (ref 30.0–36.0)
MCV: 92.2 fl (ref 78.0–100.0)
Monocytes Absolute: 0.7 10*3/uL (ref 0.1–1.0)
Monocytes Relative: 7.8 % (ref 3.0–12.0)
Neutro Abs: 6.4 10*3/uL (ref 1.4–7.7)
Neutrophils Relative %: 68.3 % (ref 43.0–77.0)
Platelets: 366 10*3/uL (ref 150.0–400.0)
RBC: 4.62 Mil/uL (ref 4.22–5.81)
RDW: 12.3 % (ref 11.5–15.5)
WBC: 9.4 10*3/uL (ref 4.0–10.5)

## 2022-04-24 LAB — IBC + FERRITIN
Ferritin: 31 ng/mL (ref 22.0–322.0)
Iron: 66 ug/dL (ref 42–165)
Saturation Ratios: 22.7 % (ref 20.0–50.0)
TIBC: 291.2 ug/dL (ref 250.0–450.0)
Transferrin: 208 mg/dL — ABNORMAL LOW (ref 212.0–360.0)

## 2022-04-24 LAB — VITAMIN D 25 HYDROXY (VIT D DEFICIENCY, FRACTURES): VITD: <7 ng/mL — ABNORMAL LOW (ref 30.00–100.00)

## 2022-04-24 MED ORDER — METRONIDAZOLE 250 MG PO TABS: 250.0000 mg | ORAL_TABLET | Freq: Three times a day (TID) | ORAL | 0 refills | Status: AC

## 2022-04-24 NOTE — Patient Instructions (Addendum)
Please go to the lab in the basement of our building to have lab work done as you leave today. Hit "B" for basement when you get on the elevator.  When the doors open the lab is on your left.  We will call you with the results. Thank you.   Please complete the course of Flagyl.  We are sending another course that you can use IF your symptoms recur.  Continue Entyvio.  We are referring you to Dr. Dema Severin at [facility] [phone]).  They will contact you directly to schedule an appointment.  It may take a week or more before you hear from them.  Please feel free to contact us if you have not heard from them within 2 weeks and we will follow up on the referral.   You have been scheduled for a follow up appointment with Dr. Havery Moros on Tuesday, 06-19-22 at 2:30 pm. Please arrive 10 minutes early for registration.    Thank you for entrusting me with your care and for choosing [facility], Dr. Sherando Cellar    If you are age 91 or older, your body mass index should be between 23-30. Your Body mass index is 18.68 kg/m. If this is out of the aforementioned range listed, please consider follow up with your Primary Care Provider.  If you are age 8 or younger, your body mass index should be between 19-25. Your Body mass index is 18.68 kg/m. If this is out of the aformentioned range listed, please consider follow up with your Primary Care Provider.

## 2022-04-24 NOTE — Progress Notes (Signed)
HPI :  22 year old male here for a follow up visit for Crohn's disease.   See my initial intake note for full history (02/26/22) - in short he has Crohn's colitis complicated by perianal disease (fistula and fissures) with chronic rectal pain.  Previously followed by peds GI, Dr. Jacqlyn Krauss.  Carries a diagnosis of colitis since 2017.  He has been on Humira in the past but it did not provide any benefit, sounds like he was a primary nonresponder.  He has been on courses of steroids over the years.  Has a history of C. difficile.  Was on Harriette Ohara for what sounds like a year or so in 2023, but it did not control him and he had flare despite that.  After my last visit with him on his intake history, given his rectal pain we decided to do an MRI of his pelvis which showed a perianal fistula without abscess.  He also had a fecal calprotectin that was in the 3000's.  We stopped the Harriette Ohara given this was clearly not working, transitioned him to [place] after discussion of options.  He had his first dose in December 20, has had 2 doses so far, due for his next dose on Feb 1st.  I had also given him diltiazem/lidocaine ointment to treat the fissures, and more recently put him on a course of Flagyl for the perianal fistula when he called in complaining of worsening rectal pain.  Since I have have last seen him generally he is feeling better.  He does feel that the [name] is helping and his symptoms globally feel improved.  He has more energy, able to stand longer at work.  He is tolerating the Entyvio well and is hopeful this will work for him.  He does notice a difference since adding Flagyl.  About 2 weeks ago he states he had a "flareup" of his rectal pain along with rectal bleeding and some urgency.  This has improved.  He has been on Flagyl for the past 5 days or so.  He has mostly been using his diltiazem gel at least twice daily or so.  He still has symptoms at time but generally doing well.  We  discussed long-term plan.  Of note he is also had a history of iron deficiency.  Labs in care everywhere reviewed from October of this past year show a hemoglobin of 13.8, MCV of 87.7.  ESR of 14, CRP undetectable.  Liver function and renal function normal.   Colonoscopy 09/30/20 - active left sided colitis on biopsies- moderate   Colonoscopy 01/18/2022 - Dr. Jacqlyn Krauss - active inflammation from anal margin to 20cm from anal verge - May score 1, improved from prior exam reportedly. Normal ileum, cecal patch noted. Poor prep noted.   Path shows normal right / transverse colon, mildly active left sided disease     MRI pelvis 03/14/22: IMPRESSION: Simple intersphincteric perianal fistula originating at the 6 o'clock position of the inferior anal sphincter, with extension in the subcutaneous tissues along the left side of the gluteal crease. No evidence of abscess.  Fecal calprotectin 3660 - 03/16/22  1st Enyvio  infusion 03/21/22   Past Medical History:  Diagnosis Date   ADHD (attention deficit hyperactivity disorder)    Anal fissure    Anemia    Anxiety    Chronic constipation    Depression    Encopresis(307.7)    Growth hormone deficiency (HCC)    GSE (gluten-sensitive enteropathy)    Inflammatory bowel disease  Lactose intolerance    Mood disorder (HCC)    Psoriasis    Scoliosis    Short stature    Solitary rectal ulcer syndrome    Ulcerative pancolitis with other complication [facility])      Past Surgical History:  Procedure Laterality Date   ADENOIDECTOMY     COLONOSCOPY N/A 12/27/2015   Procedure: COLONOSCOPY;  Surgeon: Joycelyn Rua, MD;  Location: [place];  Service: Gastroenterology;  Laterality: N/A;   ESOPHAGOGASTRODUODENOSCOPY N/A 12/27/2015   Procedure: ESOPHAGOGASTRODUODENOSCOPY (EGD);  Surgeon: Joycelyn Rua, MD;  Location: [place];  Service: Gastroenterology;  Laterality: N/A;   ESOPHAGOGASTRODUODENOSCOPY N/A 08/03/2016   Procedure:  ESOPHAGOGASTRODUODENOSCOPY (EGD);  Surgeon: Joycelyn Rua, MD;  Location: [place];  Service: Gastroenterology;  Laterality: N/A;   FLEXIBLE SIGMOIDOSCOPY  08/03/2016   Procedure: FLEXIBLE SIGMOIDOSCOPY;  Surgeon: Joycelyn Rua, MD;  Location: [facility];  Service: Gastroenterology;;   RECTOPEXY  01/19/2021   laproscopy, rectopexy for Prolapse, sigmoidoscopy, flex  ( Dr. Alain Marion)   tubes and adenoids     Family History  Problem Relation Age of Onset   Depression Mother    Miscarriages / [place] Mother    Depression Maternal Aunt    Hyperlipidemia Maternal Grandmother    Depression Maternal Grandmother    Cancer Maternal Grandfather        prostate   Hyperlipidemia Maternal Grandfather    Hypertension Maternal Grandfather    Ulcerative colitis Maternal Grandfather    Hyperlipidemia Paternal Grandmother    Thyroid disease Paternal Grandmother    Cancer Paternal Grandmother    Diabetes Paternal Grandfather    Hyperlipidemia Paternal Grandfather    Cancer Paternal Grandfather    Thyroid disease Paternal Grandfather    Celiac disease Neg Hx    Hirschsprung's disease Neg Hx    Colon cancer Neg Hx    Esophageal cancer Neg Hx    Pancreatic cancer Neg Hx    Stomach cancer Neg Hx    Social History   Tobacco Use   Smoking status: Never   Smokeless tobacco: Never  Vaping Use   Vaping Use: Never used  Substance Use Topics   Alcohol use: No   Drug use: No   Current Outpatient Medications  Medication Sig Dispense Refill   AMBULATORY NON FORMULARY MEDICATION Diltiazem gel with 5% lidocaine  Apply a pea sized amount into your rectum three times daily as needed Dispense 30 GM zero refill 30 g 0   escitalopram (LEXAPRO) 20 MG tablet Take 30 mg by mouth daily.      ferrous sulfate 325 (65 FE) MG tablet Take 325 mg by mouth daily with breakfast.     lamoTRIgine (LAMICTAL) 150 MG tablet Take 1 tablet (150 mg total) by mouth 2 (two) times daily. 60 tablet 1    lisdexamfetamine (VYVANSE) 20 MG capsule Take 20 mg by mouth daily.     lisdexamfetamine (VYVANSE) 50 MG capsule Take 50 mg by mouth daily.     metroNIDAZOLE (FLAGYL) 250 MG tablet Take 1 tablet (250 mg total) by mouth 3 (three) times daily for 14 days. 42 tablet 0   NON FORMULARY Stool softner     polyethylene glycol (MIRALAX) 17 g packet Take 17 g by mouth daily. Titrate as needed 14 each 0   traZODone (DESYREL) 50 MG tablet Take 50 mg by mouth at bedtime.     Vedolizumab (ENTYVIO IV) Inject into the vein.     No current facility-administered medications for this visit.  Allergies  Allergen Reactions   Trileptal [Oxcarbazepine] Rash     Review of Systems: All systems reviewed and negative except where noted in HPI.   Labs reviewed in Epic  Physical Exam: BP 104/74   Pulse (!) 126   Ht 5' 3.5" (1.613 m)   Wt 107 lb 2 oz (48.6 kg)   SpO2 96%   BMI 18.68 kg/m  Constitutional: Pleasant,well-developed, male in no acute distress. Perianal exam - no obvious fistulous tract, interval healing of posterior midline anal fissure. No abscess noted today. CMA Harrel Lemon as standby Extremities: no edema Lymphadenopathy: No cervical adenopathy noted. Neurological: Alert and oriented to person place and time. Skin: Skin is warm and dry. No rashes noted. Psychiatric: Normal mood and affect. Behavior is normal.   ASSESSMENT: 22 y.o. male here for assessment of the following  1. Crohn's disease of large intestine with other complication ([place])   2. Perianal fistula   3. Anal fissure    Crohn's colitis complicated by perianal fistula and anal fissure.  As above, he was a primary nonresponder to Humira then transition to [place] which did not work for him either.  Since his last visit we found he had perianal fistula on MRI.  On Entyvio now status post 2 doses with global improvement of symptoms.  Addition of diltiazem/lidocaine gel routinely as well as Flagyl more recently for  fistula, has provided benefit to his rectal pain and generally he is feeling better.  We discussed his course.  Weyman Rodney can take some time to work and we will see how he does over the next few months.  He will continue this for now, we will plan on checking another fecal calprotectin in another month or 2.  Given his history of significant rectal plain with severe fissures and now perianal fistula on MRI, would like him plugged into colorectal surgery in case he needs their assistance in the future with seton etc, but he has no evidence of abscess on exam today.  I will refer him to [facility] colorectal surgery.  He is agreeable with this.  I will have him continue the few weeks of Flagyl, will refill this for him to use for another month if he wishes and finds it helpful.  Will keep on a dose less than 1 g/day to prevent neuropathy.  Will plan on repeating some basic labs today to make sure no anemia, check vitamin D status, check iron studies.  Ideally, fecal calprotectin at his next visit and then plan another colonoscopy within 6 months from starting [facility].  Pending his course we may need to check levels to make sure his dose is optimized.  If he is feeling poorly in the interim he should contact me, overall encouraged by his  course initially with Entyvio.  PLAN: - lab today for CBC, vitamin D, TIBC / ferritin - continue Entyvio - next dose scheduled for Feb 1 - finish course of Flagyl, refilled to use PRN - continue diltiazem / lidocaine gel - refer to Dr. Dema Severin [facility] - perianal Crohn's disease / chronic rectal pain - f/u 2 months - likely fecal calpro at next visit and colonoscopy in upcoming months  Jolly Mango, MD [facility] Gastroenterology

## 2022-04-25 ENCOUNTER — Other Ambulatory Visit: Payer: Self-pay

## 2022-04-25 MED ORDER — VITAMIN D-3 125 MCG (5000 UT) PO TABS: 5000.0000 [IU] | ORAL_TABLET | Freq: Every day | ORAL | Status: AC

## 2022-04-27 NOTE — Telephone Encounter (Signed)
Patient called requesting if it is at all possible that we refill the [facility] medication 20 mg and 50 mg for him. States he has a conflict of interest with the other provider.

## 2022-04-29 ENCOUNTER — Encounter: Payer: Self-pay | Admitting: Gastroenterology

## 2022-04-30 ENCOUNTER — Other Ambulatory Visit: Payer: Self-pay | Admitting: Pharmacy Technician

## 2022-04-30 DIAGNOSIS — Z681 Body mass index (BMI) 19 or less, adult: Secondary | ICD-10-CM | POA: Diagnosis not present

## 2022-04-30 DIAGNOSIS — Z23 Encounter for immunization: Secondary | ICD-10-CM | POA: Diagnosis not present

## 2022-04-30 DIAGNOSIS — D509 Iron deficiency anemia, unspecified: Secondary | ICD-10-CM | POA: Diagnosis not present

## 2022-04-30 DIAGNOSIS — Z131 Encounter for screening for diabetes mellitus: Secondary | ICD-10-CM | POA: Diagnosis not present

## 2022-04-30 DIAGNOSIS — R69 Illness, unspecified: Secondary | ICD-10-CM | POA: Diagnosis not present

## 2022-04-30 DIAGNOSIS — E559 Vitamin D deficiency, unspecified: Secondary | ICD-10-CM | POA: Diagnosis not present

## 2022-04-30 DIAGNOSIS — K50919 Crohn's disease, unspecified, with unspecified complications: Secondary | ICD-10-CM | POA: Diagnosis not present

## 2022-04-30 DIAGNOSIS — Z13220 Encounter for screening for lipoid disorders: Secondary | ICD-10-CM | POA: Diagnosis not present

## 2022-04-30 DIAGNOSIS — Z0000 Encounter for general adult medical examination without abnormal findings: Secondary | ICD-10-CM | POA: Diagnosis not present

## 2022-04-30 DIAGNOSIS — G4700 Insomnia, unspecified: Secondary | ICD-10-CM | POA: Diagnosis not present

## 2022-04-30 NOTE — Telephone Encounter (Signed)
Good Morning [name],  The Amanda IV has been approved from 02/28/22 - 02/27/23. Key: [id number]. It was submitted through Cover My Meds, I have sent it to the media tab for your records. I will f/u with [employer] for verification of the approval.  He has also been approved for the [facility] co-pay card to assist with the deductible cost.  From what has been scanned into to media tab is that the [facility]) entyvio has been denied.  Since the patient has new insurance with [employer] I will submit a new auth.   For now we will cancel his upcoming appt until Christella Scheuermann has approved the Entyvio IV.   @yatin  can you take look at the bill/claim and see why the claim has been denied.

## 2022-04-30 NOTE — Telephone Encounter (Signed)
[name], please see message from patient's mother attached. Please let them know what you find out. Thanks

## 2022-04-30 NOTE — Telephone Encounter (Signed)
Informed patient that Dr.Armbruster could not refill the medication. Patient stated he is headed to an appointment with him Primary care to have it refilled.

## 2022-05-03 ENCOUNTER — Ambulatory Visit: Payer: Self-pay

## 2022-05-07 ENCOUNTER — Telehealth: Payer: Self-pay | Admitting: Pharmacy Technician

## 2022-05-07 ENCOUNTER — Telehealth: Payer: Self-pay

## 2022-05-07 NOTE — Telephone Encounter (Signed)
Auth Submission: PENDING - NEW INSURANCE  Payer: [employer] Medication & CPT/J Code(s) submitted: Entyvio (Vedolizumab) [id number] Route of submission (phone, fax, portal):  Phone # Fax # Auth type: Buy/Bill Units/visits requested: 300MG  Q8WKS Reference number: [id number] Approval from:  to

## 2022-05-07 NOTE — Telephone Encounter (Signed)
Patient has been referred to [facility].  He has an appointment on 2-21 at 10:30am with Dr. Dema Severin for Crohn's perianal, chronic rectal pain

## 2022-05-08 ENCOUNTER — Encounter: Payer: Self-pay | Admitting: Gastroenterology

## 2022-05-08 ENCOUNTER — Other Ambulatory Visit: Payer: Self-pay | Admitting: Pharmacy Technician

## 2022-05-09 ENCOUNTER — Encounter: Payer: Self-pay | Admitting: Gastroenterology

## 2022-05-09 NOTE — Telephone Encounter (Signed)
[name], We will look into getting the authorization retro dated. Unfortunately the patient did not inform us he had new insurance, thus there was no approved authorization for 04/05/22.  As of now there is an approved auth under his new insurance with [employer] until 04/27/23. We understand the patients concern and will try to get a retro auth for 04/05/22. I will f/u once I have a response from [place].  Thanks [name]

## 2022-05-10 ENCOUNTER — Encounter: Payer: Self-pay | Admitting: Gastroenterology

## 2022-05-16 ENCOUNTER — Other Ambulatory Visit (HOSPITAL_COMMUNITY): Payer: Self-pay

## 2022-05-16 ENCOUNTER — Telehealth: Payer: Self-pay | Admitting: Pharmacy Technician

## 2022-05-16 ENCOUNTER — Encounter: Payer: Self-pay | Admitting: Gastroenterology

## 2022-05-16 NOTE — Telephone Encounter (Signed)
Patient Advocate Encounter  Received notification from [facility] that prior authorization for ENTYVIO 108MG is required.   PA submitted on 2.14.24 Key [id number] Status is pending

## 2022-05-18 ENCOUNTER — Encounter: Payer: Self-pay | Admitting: Gastroenterology

## 2022-05-21 ENCOUNTER — Encounter: Payer: Self-pay | Admitting: Gastroenterology

## 2022-05-22 ENCOUNTER — Telehealth: Payer: Self-pay | Admitting: Pharmacy Technician

## 2022-05-22 ENCOUNTER — Other Ambulatory Visit (HOSPITAL_COMMUNITY): Payer: Self-pay

## 2022-05-22 NOTE — Telephone Encounter (Signed)
Fyi note:  I have submitted an appeal for an retro auth/back date for service of 04/05/22.  Per [employer] retro auths are not allowed (only appeals). Phone: [phone] Fax: [phone] Note: Appeal process may take up to 45 days. I will f/u once I have a response from [place].

## 2022-05-22 NOTE — Telephone Encounter (Signed)
F/u: Bradley [name] was resubmitted under pharmacy benefit and medication has been ordered.  Auth Submission: APPROVED - PHARMACY BENEFIT Payer: [employer] Medication & CPT/J Code(s) submitted: Entyvio (Vedolizumab) [id number] Route of submission (phone, fax, portal):  Phone # Fax # Auth type: PHARMACY BENEFIT Units/visits requested: 300MG Q8WKS Reference number: [id number] Approval from: 05/22/22 to 05/22/23 at [place] as Pharmacy Benefit  [facility]: [phone]. Medication has been ordered and patient is scheduled for treatment 05/28/22.  @yatin$ ,  fyi flag and excell sheet has been updated.

## 2022-05-22 NOTE — Telephone Encounter (Signed)
Fyi note:  Josem Kaufmann has been submitted (Pharmacy benefit) as urgent. Awaiting response from [place]. Pending auth: [id number] Phone: [phone] Fax: [phone]

## 2022-05-23 DIAGNOSIS — K50113 Crohn's disease of large intestine with fistula: Secondary | ICD-10-CM | POA: Diagnosis not present

## 2022-05-27 DIAGNOSIS — J40 Bronchitis, not specified as acute or chronic: Secondary | ICD-10-CM | POA: Diagnosis not present

## 2022-05-27 DIAGNOSIS — R059 Cough, unspecified: Secondary | ICD-10-CM | POA: Diagnosis not present

## 2022-05-27 DIAGNOSIS — J329 Chronic sinusitis, unspecified: Secondary | ICD-10-CM | POA: Diagnosis not present

## 2022-05-28 ENCOUNTER — Ambulatory Visit (INDEPENDENT_AMBULATORY_CARE_PROVIDER_SITE_OTHER): Payer: 59

## 2022-05-28 VITALS — BP 115/65 | HR 118 | Temp 98.5°F | Resp 16 | Ht 63.5 in | Wt 105.6 lb

## 2022-05-28 DIAGNOSIS — K50113 Crohn's disease of large intestine with fistula: Secondary | ICD-10-CM

## 2022-05-28 MED ORDER — VEDOLIZUMAB 300 MG IV SOLR
300.0000 mg | Freq: Once | INTRAVENOUS | Status: AC
  Administered 2022-05-28: 300 mg via INTRAVENOUS
  Filled 2022-05-28: qty 5

## 2022-05-28 NOTE — Progress Notes (Signed)
Diagnosis: Crohn's Disease  Provider:  Marshell Garfinkel MD  Procedure: Infusion  IV Type: Peripheral, IV Location: L Antecubital  Entyvio (Vedolizumab), Dose: 300 mg  Infusion Start Time: 1024  Infusion Stop Time: 1100  Post Infusion IV Care: Peripheral IV Discontinued  Discharge: Condition: Good, Destination: Home . AVS Provided and AVS Declined  Performed by:  Arnoldo Morale, RN

## 2022-06-06 DIAGNOSIS — R051 Acute cough: Secondary | ICD-10-CM | POA: Diagnosis not present

## 2022-06-06 DIAGNOSIS — Z681 Body mass index (BMI) 19 or less, adult: Secondary | ICD-10-CM | POA: Diagnosis not present

## 2022-06-06 DIAGNOSIS — Z03818 Encounter for observation for suspected exposure to other biological agents ruled out: Secondary | ICD-10-CM | POA: Diagnosis not present

## 2022-06-06 DIAGNOSIS — J069 Acute upper respiratory infection, unspecified: Secondary | ICD-10-CM | POA: Diagnosis not present

## 2022-06-18 NOTE — Progress Notes (Unsigned)
HPI :  22 year old male here for a follow up visit for Crohn's disease.    See my initial intake note for full history (02/26/22) - in short he has Crohn's colitis complicated by perianal disease (fistula and fissures) with chronic rectal pain.   Previously followed by peds GI, Dr. Yehuda Savannah.  Carries a diagnosis of colitis since 2017.  He has been on Humira in the past but it did not provide any benefit, sounds like he was a primary nonresponder.  He has been on courses of steroids over the years.  Has a history of C. difficile.  Was on Morrie Sheldon for what sounds like a year or so in 2023, but it did not control him and he had flare despite that.   After my last visit with him on his intake history, given his rectal pain we decided to do an MRI of his pelvis which showed a perianal fistula without abscess.  He also had a fecal calprotectin that was in the 3000's.  We stopped the Morrie Sheldon given this was clearly not working, transitioned him to [place] after discussion of options.  He had his first dose in December 23, has had 2 doses so far, due for his next dose on Feb 23.  I had also given him diltiazem/lidocaine ointment to treat the fissures, and more recently put him on a course of Flagyl for the perianal fistula when he called in complaining of worsening rectal pain.   Since I have have last seen him generally he is feeling better.  He does feel that the Weyman Rodney is helping and his symptoms globally feel improved.  He has more energy, able to stand longer at work.  He is tolerating the Entyvio well and is hopeful this will work for him.  He does notice a difference since adding Flagyl.  About 2 weeks ago he states he had a "flareup" of his rectal pain along with rectal bleeding and some urgency.  This has improved.  He has been on Flagyl for the past 5 days or so.  He has mostly been using his diltiazem gel at least twice daily or so.  He still has symptoms at time but generally doing well.   We  discussed long-term plan.   Of note he is also had a history of iron deficiency.  Labs in care everywhere reviewed from October of this past year show a hemoglobin of 13.8, MCV of 87.7.  ESR of 14, CRP undetectable.  Liver function and renal function normal.   Colonoscopy 09/30/20 - active left sided colitis on biopsies- moderate   Colonoscopy 01/18/2022 - Dr. Yehuda Savannah - active inflammation from anal margin to 20cm from anal verge - May score 1, improved from prior exam reportedly. Normal ileum, cecal patch noted. Poor prep noted.   Path shows normal right / transverse colon, mildly active left sided disease     MRI pelvis 03/14/22: IMPRESSION: Simple intersphincteric perianal fistula originating at the 6 o'clock position of the inferior anal sphincter, with extension in the subcutaneous tissues along the left side of the gluteal crease. No evidence of abscess.   Fecal calprotectin 3660 - 03/16/22   1st Enyvio  infusion 03/21/22    22 y.o. male here for assessment of the following   1. Crohn's disease of large intestine with other complication ([place])   2. Perianal fistula   3. Anal fissure     Crohn's colitis complicated by perianal fistula and anal fissure.  As above, he was  a primary nonresponder to Humira then transition to [place] which did not work for him either.  Since his last visit we found he had perianal fistula on MRI.   On Entyvio now status post 2 doses with global improvement of symptoms.  Addition of diltiazem/lidocaine gel routinely as well as Flagyl more recently for fistula, has provided benefit to his rectal pain and generally he is feeling better.  We discussed his course.  Weyman Rodney can take some time to work and we will see how he does over the next few months.  He will continue this for now, we will plan on checking another fecal calprotectin in another month or 2.  Given his history of significant rectal plain with severe fissures and now perianal fistula on MRI,  would like him plugged into colorectal surgery in case he needs their assistance in the future with seton etc, but he has no evidence of abscess on exam today.  I will refer him to [facility] colorectal surgery.  He is agreeable with this.  I will have him continue the few weeks of Flagyl, will refill this for him to use for another month if he wishes and finds it helpful.  Will keep on a dose less than 1 g/day to prevent neuropathy.   Will plan on repeating some basic labs today to make sure no anemia, check vitamin D status, check iron studies.  Ideally, fecal calprotectin at his next visit and then plan another colonoscopy within 6 months from starting [facility].  Pending his course we may need to check levels to make sure his dose is optimized.  If he is feeling poorly in the interim he should contact me, overall encouraged by his  course initially with Entyvio.   PLAN: - lab today for CBC, vitamin D, TIBC / ferritin - continue Entyvio - next dose scheduled for Feb 1 - finish course of Flagyl, refilled to use PRN - continue diltiazem / lidocaine gel - refer to Dr. Dema Severin [facility] - perianal Crohn's disease / chronic rectal pain - f/u 2 months - likely fecal calpro at next visit and colonoscopy in upcoming months    Anemia resolved Iron studies better Vitamin D low - repleeted          Past Medical History:  Diagnosis Date   ADHD (attention deficit hyperactivity disorder)    Anal fissure    Anemia    Anxiety    Chronic constipation    Depression    Encopresis(307.7)    Growth hormone deficiency (HCC)    GSE (gluten-sensitive enteropathy)    Inflammatory bowel disease    Lactose intolerance    Mood disorder ([place])    Psoriasis    Scoliosis    Short stature    Solitary rectal ulcer syndrome    Ulcerative pancolitis with other complication [facility])      Past Surgical History:  Procedure Laterality Date   ADENOIDECTOMY     COLONOSCOPY N/A 12/27/2015   Procedure: COLONOSCOPY;  Surgeon:  Joycelyn Rua, MD;  Location: [place];  Service: Gastroenterology;  Laterality: N/A;   ESOPHAGOGASTRODUODENOSCOPY N/A 12/27/2015   Procedure: ESOPHAGOGASTRODUODENOSCOPY (EGD);  Surgeon: Joycelyn Rua, MD;  Location: [place];  Service: Gastroenterology;  Laterality: N/A;   ESOPHAGOGASTRODUODENOSCOPY N/A 08/03/2016   Procedure: ESOPHAGOGASTRODUODENOSCOPY (EGD);  Surgeon: Joycelyn Rua, MD;  Location: [place];  Service: Gastroenterology;  Laterality: N/A;   FLEXIBLE SIGMOIDOSCOPY  08/03/2016   Procedure: FLEXIBLE SIGMOIDOSCOPY;  Surgeon: Joycelyn Rua, MD;  Location: [place];  Service:  Gastroenterology;;   RECTOPEXY  01/19/2021   laproscopy, rectopexy for Prolapse, sigmoidoscopy, flex  ( Dr. Alain Marion)   tubes and adenoids     Family History  Problem Relation Age of Onset   Depression Mother    Miscarriages / [place] Mother    Depression Maternal Aunt    Hyperlipidemia Maternal Grandmother    Depression Maternal Grandmother    Cancer Maternal Grandfather        prostate   Hyperlipidemia Maternal Grandfather    Hypertension Maternal Grandfather    Ulcerative colitis Maternal Grandfather    Hyperlipidemia Paternal Grandmother    Thyroid disease Paternal Grandmother    Cancer Paternal Grandmother    Diabetes Paternal Grandfather    Hyperlipidemia Paternal Grandfather    Cancer Paternal Grandfather    Thyroid disease Paternal Grandfather    Celiac disease Neg Hx    Hirschsprung's disease Neg Hx    Colon cancer Neg Hx    Esophageal cancer Neg Hx    Pancreatic cancer Neg Hx    Stomach cancer Neg Hx    Social History   Tobacco Use   Smoking status: Never   Smokeless tobacco: Never  Vaping Use   Vaping Use: Never used  Substance Use Topics   Alcohol use: No   Drug use: No   Current Outpatient Medications  Medication Sig Dispense Refill   AMBULATORY NON FORMULARY MEDICATION Diltiazem gel with 5% lidocaine  Apply a pea sized amount into your  rectum three times daily as needed Dispense 30 GM zero refill 30 g 0   Cholecalciferol (VITAMIN D-3) 125 MCG (5000 UT) TABS Take 5,000 Units by mouth daily. 30 tablet    escitalopram (LEXAPRO) 20 MG tablet Take 30 mg by mouth daily.      ferrous sulfate 325 (65 FE) MG tablet Take 325 mg by mouth daily with breakfast.     lamoTRIgine (LAMICTAL) 150 MG tablet Take 1 tablet (150 mg total) by mouth 2 (two) times daily. 60 tablet 1   lisdexamfetamine (VYVANSE) 20 MG capsule Take 20 mg by mouth daily.     lisdexamfetamine (VYVANSE) 50 MG capsule Take 50 mg by mouth daily.     NON FORMULARY Stool softner     polyethylene glycol (MIRALAX) 17 g packet Take 17 g by mouth daily. Titrate as needed 14 each 0   traZODone (DESYREL) 50 MG tablet Take 50 mg by mouth at bedtime.     Vedolizumab (ENTYVIO IV) Inject into the vein.     No current facility-administered medications for this visit.   Allergies  Allergen Reactions   Trileptal [Oxcarbazepine] Rash     Review of Systems: All systems reviewed and negative except where noted in HPI.    No results found.  Physical Exam: There were no vitals taken for this visit. Constitutional: Pleasant,well-developed, ***male in no acute distress. HEENT: Normocephalic and atraumatic. Conjunctivae are normal. No scleral icterus. Neck supple.  Cardiovascular: Normal rate, regular rhythm.  Pulmonary/chest: Effort normal and breath sounds normal. No wheezing, rales or rhonchi. Abdominal: Soft, nondistended, nontender. Bowel sounds active throughout. There are no masses palpable. No hepatomegaly. Extremities: no edema Lymphadenopathy: No cervical adenopathy noted. Neurological: Alert and oriented to person place and time. Skin: Skin is warm and dry. No rashes noted. Psychiatric: Normal mood and affect. Behavior is normal.   ASSESSMENT: 22 y.o. male here for assessment of the following  No diagnosis found.  PLAN:   Alfredo Batty Au*

## 2022-06-19 ENCOUNTER — Encounter: Payer: Self-pay | Admitting: Gastroenterology

## 2022-06-19 ENCOUNTER — Other Ambulatory Visit (INDEPENDENT_AMBULATORY_CARE_PROVIDER_SITE_OTHER): Payer: 59

## 2022-06-19 ENCOUNTER — Ambulatory Visit (INDEPENDENT_AMBULATORY_CARE_PROVIDER_SITE_OTHER): Payer: 59 | Admitting: Gastroenterology

## 2022-06-19 VITALS — BP 116/64 | HR 105 | Ht 63.5 in | Wt 105.0 lb

## 2022-06-19 DIAGNOSIS — K5909 Other constipation: Secondary | ICD-10-CM

## 2022-06-19 DIAGNOSIS — K602 Anal fissure, unspecified: Secondary | ICD-10-CM

## 2022-06-19 DIAGNOSIS — E559 Vitamin D deficiency, unspecified: Secondary | ICD-10-CM | POA: Diagnosis not present

## 2022-06-19 DIAGNOSIS — K603 Anal fistula: Secondary | ICD-10-CM

## 2022-06-19 DIAGNOSIS — K50118 Crohn's disease of large intestine with other complication: Secondary | ICD-10-CM

## 2022-06-19 LAB — CBC WITH DIFFERENTIAL/PLATELET
Basophils Absolute: 0 10*3/uL (ref 0.0–0.1)
Basophils Relative: 0.4 % (ref 0.0–3.0)
Eosinophils Absolute: 0.1 10*3/uL (ref 0.0–0.7)
Eosinophils Relative: 1.2 % (ref 0.0–5.0)
HCT: 38.9 % — ABNORMAL LOW (ref 39.0–52.0)
Hemoglobin: 13.1 g/dL (ref 13.0–17.0)
Lymphocytes Relative: 18.3 % (ref 12.0–46.0)
Lymphs Abs: 1.6 10*3/uL (ref 0.7–4.0)
MCHC: 33.8 g/dL (ref 30.0–36.0)
MCV: 89.6 fl (ref 78.0–100.0)
Monocytes Absolute: 0.5 10*3/uL (ref 0.1–1.0)
Monocytes Relative: 5.5 % (ref 3.0–12.0)
Neutro Abs: 6.4 10*3/uL (ref 1.4–7.7)
Neutrophils Relative %: 74.6 % (ref 43.0–77.0)
Platelets: 395 10*3/uL (ref 150.0–400.0)
RBC: 4.34 Mil/uL (ref 4.22–5.81)
RDW: 12.7 % (ref 11.5–15.5)
WBC: 8.6 10*3/uL (ref 4.0–10.5)

## 2022-06-19 LAB — IBC + FERRITIN
Ferritin: 51.1 ng/mL (ref 22.0–322.0)
Iron: 30 ug/dL — ABNORMAL LOW (ref 42–165)
Saturation Ratios: 10.6 % — ABNORMAL LOW (ref 20.0–50.0)
TIBC: 284.2 ug/dL (ref 250.0–450.0)
Transferrin: 203 mg/dL — ABNORMAL LOW (ref 212.0–360.0)

## 2022-06-19 LAB — VITAMIN D 25 HYDROXY (VIT D DEFICIENCY, FRACTURES): VITD: 54.81 ng/mL (ref 30.00–100.00)

## 2022-06-19 MED ORDER — LINACLOTIDE 145 MCG PO CAPS: 145.0000 ug | ORAL_CAPSULE | Freq: Every day | ORAL | 0 refills | Status: DC

## 2022-06-19 MED ORDER — LINACLOTIDE 72 MCG PO CAPS: 72.0000 ug | ORAL_CAPSULE | Freq: Every day | ORAL | 0 refills | Status: DC

## 2022-06-19 NOTE — Patient Instructions (Addendum)
Continue Entyvio  Please go to the lab in the basement of our building to have lab work done as you leave today. Hit "B" for basement when you get on the elevator.  When the doors open the lab is on your left.  We will call you with the results. Thank you.  Use Miralax once daily and titrate as needed.  We are giving you a coupon and some samples today.  We have given you samples of the following medication to take: Linzess 72 mcg: Take daily 30 minutes before a meal.  If this is not effective after a week, you can try the 145 mcg capsules  Please use your Diltiazem gel three times a day.  Stop Colace.  Thank you for entrusting me with your care and for choosing [facility], Dr. Forkland Cellar   If your blood pressure at your visit was 140/90 or greater, please contact your primary care physician to follow up on this.  _______________________________________________________  If you are age 11 or older, your body mass index should be between 23-30. Your Body mass index is 18.31 kg/m. If this is out of the aforementioned range listed, please consider follow up with your Primary Care Provider.  If you are age 67 or younger, your body mass index should be between 19-25. Your Body mass index is 18.31 kg/m. If this is out of the aformentioned range listed, please consider follow up with your Primary Care Provider.   ________________________________________________________  The [facility] providers would like to encourage you to use [facility] to communicate with providers for non-urgent requests or questions.  Due to long hold times on the telephone, sending your provider a message by [facility] may be a faster and more efficient way to get a response.  Please allow 48 business hours for a response.  Please remember that this is for non-urgent requests.  _______________________________________________________  Due to recent changes in healthcare laws, you may see the results of your  imaging and laboratory studies on MyChart before your provider has had a chance to review them.  We understand that in some cases there may be results that are confusing or concerning to you. Not all laboratory results come back in the same time frame and the provider may be waiting for multiple results in order to interpret others.  Please give [place] 48 hours in order for your provider to thoroughly review all the results before contacting the office for clarification of your results.

## 2022-07-04 ENCOUNTER — Encounter: Payer: Self-pay | Admitting: Gastroenterology

## 2022-07-06 MED ORDER — METRONIDAZOLE 250 MG PO TABS: 250.0000 mg | ORAL_TABLET | Freq: Three times a day (TID) | ORAL | 0 refills | Status: AC

## 2022-07-06 NOTE — Addendum Note (Signed)
Addended by: Missy Sabins on: 07/06/2022 02:14 PM   Modules accepted: Orders

## 2022-07-06 NOTE — Addendum Note (Signed)
Addended by: Missy Sabins on: 07/06/2022 01:45 PM   Modules accepted: Orders

## 2022-07-09 ENCOUNTER — Telehealth: Payer: Self-pay

## 2022-07-09 DIAGNOSIS — E559 Vitamin D deficiency, unspecified: Secondary | ICD-10-CM

## 2022-07-09 NOTE — Telephone Encounter (Signed)
-----   Message from Cooper Render, CMA sent at 04/25/2022 11:48 AM EST ----- Regarding: due for vitamin d level Patient started on Vit D in Jan for severe Vit D deficiency.  Recheck his level in 3 months

## 2022-07-09 NOTE — Telephone Encounter (Signed)
MyChart message sent to patient to go to the lab for Vitamin D level.

## 2022-07-13 ENCOUNTER — Other Ambulatory Visit: Payer: 59

## 2022-07-13 DIAGNOSIS — K602 Anal fissure, unspecified: Secondary | ICD-10-CM | POA: Diagnosis not present

## 2022-07-13 DIAGNOSIS — K603 Anal fistula: Secondary | ICD-10-CM

## 2022-07-13 DIAGNOSIS — K50118 Crohn's disease of large intestine with other complication: Secondary | ICD-10-CM | POA: Diagnosis not present

## 2022-07-13 DIAGNOSIS — K5909 Other constipation: Secondary | ICD-10-CM | POA: Diagnosis not present

## 2022-07-13 DIAGNOSIS — E559 Vitamin D deficiency, unspecified: Secondary | ICD-10-CM

## 2022-07-18 ENCOUNTER — Encounter: Payer: Self-pay | Admitting: Gastroenterology

## 2022-07-18 LAB — CALPROTECTIN, FECAL: Calprotectin, Fecal: 228 ug/g — ABNORMAL HIGH (ref 0–120)

## 2022-07-19 ENCOUNTER — Other Ambulatory Visit (INDEPENDENT_AMBULATORY_CARE_PROVIDER_SITE_OTHER): Payer: 59

## 2022-07-19 DIAGNOSIS — E559 Vitamin D deficiency, unspecified: Secondary | ICD-10-CM

## 2022-07-19 LAB — VITAMIN D 25 HYDROXY (VIT D DEFICIENCY, FRACTURES): VITD: 65.34 ng/mL (ref 30.00–100.00)

## 2022-07-23 ENCOUNTER — Ambulatory Visit (INDEPENDENT_AMBULATORY_CARE_PROVIDER_SITE_OTHER): Payer: 59

## 2022-07-23 ENCOUNTER — Telehealth: Payer: Self-pay | Admitting: Pharmacy Technician

## 2022-07-23 ENCOUNTER — Other Ambulatory Visit: Payer: Self-pay | Admitting: Pharmacy Technician

## 2022-07-23 VITALS — BP 100/62 | HR 104 | Temp 98.0°F | Resp 18 | Ht 63.5 in | Wt 105.4 lb

## 2022-07-23 DIAGNOSIS — K50113 Crohn's disease of large intestine with fistula: Secondary | ICD-10-CM

## 2022-07-23 MED ORDER — VEDOLIZUMAB 300 MG IV SOLR
300.0000 mg | Freq: Once | INTRAVENOUS | Status: AC
  Administered 2022-07-23: 300 mg via INTRAVENOUS
  Filled 2022-07-23: qty 5

## 2022-07-23 NOTE — Telephone Encounter (Addendum)
Dr. Adela Lank,  Q4WK dosing; Denied  Q6WK dosing: APPROVED  Auth Submission: APPROVED FOR Q6WK DOSING.  Second level of appeal denied for q4wk dosing   Letter has been scanned to media tab for review.  Site of care: Site of care: [facility] Payer: [employer] Medication & CPT/J Code(s) submitted: Entyvio (Vedolizumab) [id number] Route of submission (phone, fax, portal):   Phone # [phone] Fax #[phone] Auth type: PHARMACY BENEFIT Units/visits requested: Q6WKS Reference number: [id number] Approval from: 07/27/22 - 4/25-25

## 2022-07-23 NOTE — Progress Notes (Signed)
Diagnosis: Crohn's Disease  Provider:  Chilton Greathouse MD  Procedure: Infusion  IV Type: Peripheral, IV Location: R Forearm  Entyvio (Vedolizumab), Dose: 300 mg  Infusion Start Time: 1016  Infusion Stop Time: 1049  Post Infusion IV Care: Peripheral IV Discontinued  Discharge: Condition: Good, Destination: Home . AVS Declined  Performed by:  Garnette Czech, RN

## 2022-07-24 ENCOUNTER — Other Ambulatory Visit: Payer: Self-pay | Admitting: Pharmacy Technician

## 2022-07-24 ENCOUNTER — Encounter: Payer: Self-pay | Admitting: Gastroenterology

## 2022-07-27 ENCOUNTER — Telehealth: Payer: Self-pay

## 2022-07-27 ENCOUNTER — Encounter: Payer: Self-pay | Admitting: Gastroenterology

## 2022-07-27 ENCOUNTER — Encounter: Payer: Self-pay | Admitting: Pharmacy Technician

## 2022-07-27 NOTE — Telephone Encounter (Signed)
Dr. Adela Lank. Patient has been notified via voice message and My-chart.

## 2022-07-27 NOTE — Telephone Encounter (Signed)
-----   Message from Benancio Deeds, MD sent at 07/26/2022  1:07 PM EDT ----- Randie Heinz, thank you. Appreciate the follow up on this.  Dr. Adela Lank ----- Message ----- From: Amalia Greenhouse, CPhT Sent: 07/25/2022  12:13 PM EDT To: Benancio Deeds, MD; Desma Mcgregor, [facility]; #  Dr. Adela Lank.  You may start the appeal by calling Phone: [phone] Fax: [phone] Case id: [id number]  [name] ----- Message ----- From: Desma Mcgregor, [facility] Sent: 07/25/2022  11:58 AM EDT To: Amalia Greenhouse, CPhT; #  The denial letter didn't specify a direct number.  [email or web address]  - can you call and get a direct number that Dr. Adela Lank can call to do this appeal.  Thanks, Smitty Cords   ----- Message ----- From: Benancio Deeds, MD Sent: 07/25/2022  11:31 AM EDT To: Amalia Greenhouse, CPhT; Desma Mcgregor, [facility]; #  Okay thanks very much for your help working on this. May be best to do a phone appeal at this point. Do you know how I set that up or who I can call to coordinate? Thanks  ----- Message ----- From: Desma Mcgregor, [facility] Sent: 07/25/2022  11:18 AM EDT To: Amalia Greenhouse, CPhT; #  Hello,  Iley's denial for Entyvio q4 weeks came through as expected. As part of the initial prior auth we did submit your recent chart notes along with labs showing elevated calprotectin and discussion surrounding transitioning to q4 weeks infusion.  However, it still denied as the medical policy covers q8weeks. I have the denial scanned into media tab. The next steps are to submit a letter from MD describing why q4 weeks in medically necessary and any data supporting that for this patient. Urgent appeals can also be done via phone.   His next dose would normally be due 08/22/22, if the appeal gets denied, then let us know if you want to continue q8 weeks?  Also note, since this was submitted as an expedited prior auth request, the urgency window in open till 4/26 430pm.  Thanks, Smitty Cords

## 2022-07-27 NOTE — Telephone Encounter (Signed)
Great thank you all for your help with this

## 2022-07-27 NOTE — Telephone Encounter (Signed)
Okay thank you - good to know. I would proceed with every 6 week dosing for now if that is approved. Can you please let the patient know? Thanks

## 2022-08-02 DIAGNOSIS — F909 Attention-deficit hyperactivity disorder, unspecified type: Secondary | ICD-10-CM | POA: Diagnosis not present

## 2022-08-02 DIAGNOSIS — F4310 Post-traumatic stress disorder, unspecified: Secondary | ICD-10-CM | POA: Diagnosis not present

## 2022-08-02 DIAGNOSIS — F40232 Fear of other medical care: Secondary | ICD-10-CM | POA: Diagnosis not present

## 2022-08-21 DIAGNOSIS — F40232 Fear of other medical care: Secondary | ICD-10-CM | POA: Diagnosis not present

## 2022-08-21 DIAGNOSIS — F4310 Post-traumatic stress disorder, unspecified: Secondary | ICD-10-CM | POA: Diagnosis not present

## 2022-08-21 DIAGNOSIS — F909 Attention-deficit hyperactivity disorder, unspecified type: Secondary | ICD-10-CM | POA: Diagnosis not present

## 2022-09-03 ENCOUNTER — Ambulatory Visit (INDEPENDENT_AMBULATORY_CARE_PROVIDER_SITE_OTHER): Payer: 59

## 2022-09-03 VITALS — BP 119/62 | HR 110 | Temp 98.0°F | Resp 16 | Ht 63.5 in | Wt 106.0 lb

## 2022-09-03 DIAGNOSIS — K50113 Crohn's disease of large intestine with fistula: Secondary | ICD-10-CM | POA: Diagnosis not present

## 2022-09-03 MED ORDER — VEDOLIZUMAB 300 MG IV SOLR
300.0000 mg | Freq: Once | INTRAVENOUS | Status: AC
  Administered 2022-09-03: 300 mg via INTRAVENOUS
  Filled 2022-09-03: qty 5

## 2022-09-03 NOTE — Progress Notes (Signed)
Diagnosis: Crohn's Disease  Provider:  Chilton Greathouse MD  Procedure: IV Infusion  IV Type: Peripheral, IV Location: L Antecubital  Entyvio (Vedolizumab), Dose: 300 mg  Infusion Start Time: 1106  Infusion Stop Time: 1139  Post Infusion IV Care: Peripheral IV Discontinued  Discharge: Condition: Good, Destination: Home . AVS Provided  Performed by:  Garnette Czech, RN

## 2022-09-06 DIAGNOSIS — F909 Attention-deficit hyperactivity disorder, unspecified type: Secondary | ICD-10-CM | POA: Diagnosis not present

## 2022-09-06 DIAGNOSIS — F4310 Post-traumatic stress disorder, unspecified: Secondary | ICD-10-CM | POA: Diagnosis not present

## 2022-09-06 DIAGNOSIS — F40232 Fear of other medical care: Secondary | ICD-10-CM | POA: Diagnosis not present

## 2022-09-07 DIAGNOSIS — F3181 Bipolar II disorder: Secondary | ICD-10-CM | POA: Diagnosis not present

## 2022-09-07 DIAGNOSIS — F411 Generalized anxiety disorder: Secondary | ICD-10-CM | POA: Diagnosis not present

## 2022-09-07 DIAGNOSIS — F902 Attention-deficit hyperactivity disorder, combined type: Secondary | ICD-10-CM | POA: Diagnosis not present

## 2022-09-20 DIAGNOSIS — F40232 Fear of other medical care: Secondary | ICD-10-CM | POA: Diagnosis not present

## 2022-09-20 DIAGNOSIS — F4310 Post-traumatic stress disorder, unspecified: Secondary | ICD-10-CM | POA: Diagnosis not present

## 2022-09-20 DIAGNOSIS — F909 Attention-deficit hyperactivity disorder, unspecified type: Secondary | ICD-10-CM | POA: Diagnosis not present

## 2022-09-28 ENCOUNTER — Encounter: Payer: Self-pay | Admitting: Gastroenterology

## 2022-09-28 DIAGNOSIS — F902 Attention-deficit hyperactivity disorder, combined type: Secondary | ICD-10-CM | POA: Diagnosis not present

## 2022-09-28 DIAGNOSIS — G4700 Insomnia, unspecified: Secondary | ICD-10-CM | POA: Diagnosis not present

## 2022-09-28 DIAGNOSIS — F3181 Bipolar II disorder: Secondary | ICD-10-CM | POA: Diagnosis not present

## 2022-09-28 DIAGNOSIS — F411 Generalized anxiety disorder: Secondary | ICD-10-CM | POA: Diagnosis not present

## 2022-09-28 MED ORDER — AMBULATORY NON FORMULARY MEDICATION: 0 refills | Status: DC

## 2022-09-28 NOTE — Telephone Encounter (Signed)
Called [facility] pharmacy and spoke with Sam. Jovita Gamma Sam verbal order for 1 refill of Diltiazem with Lidocaine gel. Pt notified via MyChart.

## 2022-10-15 ENCOUNTER — Ambulatory Visit: Payer: 59

## 2022-10-18 ENCOUNTER — Ambulatory Visit (INDEPENDENT_AMBULATORY_CARE_PROVIDER_SITE_OTHER): Payer: 59

## 2022-10-18 VITALS — BP 98/52 | HR 111 | Temp 98.5°F | Resp 18 | Ht 63.5 in | Wt 107.4 lb

## 2022-10-18 DIAGNOSIS — K50113 Crohn's disease of large intestine with fistula: Secondary | ICD-10-CM | POA: Diagnosis not present

## 2022-10-18 MED ORDER — VEDOLIZUMAB 300 MG IV SOLR
300.0000 mg | Freq: Once | INTRAVENOUS | Status: AC
  Administered 2022-10-18: 300 mg via INTRAVENOUS
  Filled 2022-10-18: qty 5

## 2022-10-18 NOTE — Progress Notes (Addendum)
Diagnosis: Crohn's Disease  Provider:  Chilton Greathouse MD  Procedure: IV Infusion  IV Type: Peripheral, IV Location: L Antecubital  Entyvio (Vedolizumab), Dose: 300 mg  Infusion Start Time: 1418  Infusion Stop Time: 1450  Post Infusion IV Care: Patient declined observation PIV removed.  Discharge: Condition: Good, Destination: Home . AVS Provided  Performed by:  Rico Ala, LPN

## 2022-10-26 DIAGNOSIS — G4700 Insomnia, unspecified: Secondary | ICD-10-CM | POA: Diagnosis not present

## 2022-10-26 DIAGNOSIS — F411 Generalized anxiety disorder: Secondary | ICD-10-CM | POA: Diagnosis not present

## 2022-10-26 DIAGNOSIS — F3181 Bipolar II disorder: Secondary | ICD-10-CM | POA: Diagnosis not present

## 2022-10-26 DIAGNOSIS — F902 Attention-deficit hyperactivity disorder, combined type: Secondary | ICD-10-CM | POA: Diagnosis not present

## 2022-11-13 DIAGNOSIS — F331 Major depressive disorder, recurrent, moderate: Secondary | ICD-10-CM | POA: Diagnosis not present

## 2022-11-19 ENCOUNTER — Telehealth: Payer: Self-pay

## 2022-11-19 DIAGNOSIS — K50118 Crohn's disease of large intestine with other complication: Secondary | ICD-10-CM

## 2022-11-19 NOTE — Telephone Encounter (Signed)
MyChart message sent to patient with lab reminder.   Fecal calprotectin order in epic.

## 2022-11-19 NOTE — Telephone Encounter (Signed)
-----   Message from Nurse Galena P sent at 07/18/2022  1:41 PM EDT ----- Regarding: 91-month fecal calprotectin Fecal calprotectin - need to enter order

## 2022-11-19 NOTE — Telephone Encounter (Signed)
Patient viewed MyChart message with reminder. Last read by Bradley [name] at  8:32 AM on 11/19/2022.

## 2022-11-20 ENCOUNTER — Other Ambulatory Visit: Payer: 59

## 2022-11-22 DIAGNOSIS — F3181 Bipolar II disorder: Secondary | ICD-10-CM | POA: Diagnosis not present

## 2022-11-22 DIAGNOSIS — G4700 Insomnia, unspecified: Secondary | ICD-10-CM | POA: Diagnosis not present

## 2022-11-22 DIAGNOSIS — F411 Generalized anxiety disorder: Secondary | ICD-10-CM | POA: Diagnosis not present

## 2022-11-22 DIAGNOSIS — F902 Attention-deficit hyperactivity disorder, combined type: Secondary | ICD-10-CM | POA: Diagnosis not present

## 2022-11-26 ENCOUNTER — Other Ambulatory Visit: Payer: 59

## 2022-11-26 DIAGNOSIS — K50118 Crohn's disease of large intestine with other complication: Secondary | ICD-10-CM

## 2022-11-28 DIAGNOSIS — G4700 Insomnia, unspecified: Secondary | ICD-10-CM | POA: Diagnosis not present

## 2022-11-28 DIAGNOSIS — F411 Generalized anxiety disorder: Secondary | ICD-10-CM | POA: Diagnosis not present

## 2022-11-28 DIAGNOSIS — F3181 Bipolar II disorder: Secondary | ICD-10-CM | POA: Diagnosis not present

## 2022-11-28 DIAGNOSIS — F902 Attention-deficit hyperactivity disorder, combined type: Secondary | ICD-10-CM | POA: Diagnosis not present

## 2022-11-28 LAB — CALPROTECTIN, FECAL: Calprotectin, Fecal: 240 ug/g — ABNORMAL HIGH (ref 0–120)

## 2022-11-29 DIAGNOSIS — F331 Major depressive disorder, recurrent, moderate: Secondary | ICD-10-CM | POA: Diagnosis not present

## 2022-11-30 ENCOUNTER — Ambulatory Visit (INDEPENDENT_AMBULATORY_CARE_PROVIDER_SITE_OTHER): Payer: 59

## 2022-11-30 ENCOUNTER — Ambulatory Visit: Payer: 59

## 2022-11-30 VITALS — BP 108/71 | HR 120 | Temp 98.7°F | Resp 20 | Ht 63.0 in | Wt 108.0 lb

## 2022-11-30 DIAGNOSIS — K50113 Crohn's disease of large intestine with fistula: Secondary | ICD-10-CM

## 2022-11-30 MED ORDER — VEDOLIZUMAB 300 MG IV SOLR
300.0000 mg | Freq: Once | INTRAVENOUS | Status: AC
  Administered 2022-11-30: 300 mg via INTRAVENOUS
  Filled 2022-11-30: qty 5

## 2022-11-30 NOTE — Progress Notes (Signed)
Diagnosis: Crohn's Disease  Provider:  Chilton Greathouse MD  Procedure: IV Infusion  IV Type: Peripheral, IV Location: L Antecubital  Entyvio (Vedolizumab), Dose: 300 mg  Infusion Start Time: 0950  Infusion Stop Time: 1025  Post Infusion IV Care: Patient declined observation and Peripheral IV Discontinued  Discharge: Condition: Good, Destination: Home . AVS Provided  Performed by:  Adriana Mccallum, RN

## 2022-12-03 ENCOUNTER — Encounter: Payer: Self-pay | Admitting: Gastroenterology

## 2022-12-04 ENCOUNTER — Other Ambulatory Visit: Payer: Self-pay

## 2022-12-04 MED ORDER — AMBULATORY NON FORMULARY MEDICATION: 0 refills | Status: AC

## 2022-12-06 DIAGNOSIS — F331 Major depressive disorder, recurrent, moderate: Secondary | ICD-10-CM | POA: Diagnosis not present

## 2022-12-12 DIAGNOSIS — F3181 Bipolar II disorder: Secondary | ICD-10-CM | POA: Diagnosis not present

## 2022-12-12 DIAGNOSIS — F902 Attention-deficit hyperactivity disorder, combined type: Secondary | ICD-10-CM | POA: Diagnosis not present

## 2022-12-12 DIAGNOSIS — G4700 Insomnia, unspecified: Secondary | ICD-10-CM | POA: Diagnosis not present

## 2022-12-12 DIAGNOSIS — F411 Generalized anxiety disorder: Secondary | ICD-10-CM | POA: Diagnosis not present

## 2022-12-13 DIAGNOSIS — F331 Major depressive disorder, recurrent, moderate: Secondary | ICD-10-CM | POA: Diagnosis not present

## 2022-12-18 DIAGNOSIS — M4185 Other forms of scoliosis, thoracolumbar region: Secondary | ICD-10-CM | POA: Diagnosis not present

## 2022-12-18 DIAGNOSIS — Z681 Body mass index (BMI) 19 or less, adult: Secondary | ICD-10-CM | POA: Diagnosis not present

## 2022-12-20 DIAGNOSIS — F331 Major depressive disorder, recurrent, moderate: Secondary | ICD-10-CM | POA: Diagnosis not present

## 2022-12-25 NOTE — Progress Notes (Unsigned)
HPI :  Last seen March 4575:  22 year old male here for follow-up visit for Crohn's disease, accompanied by his father.   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   Previously followed by peds GI, Dr. Jacqlyn [name].  Carries a diagnosis of colitis since 2017.  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  He has been on courses of steroids over the years.  Has a history of C. difficile.  Was on Bradley [name] for what sounds like a year or so in 2023, but it did not control him and he had flare despite that.   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  He has been on Entyvio since December 2023.   SINCE LAST VISIT   Patient's been on Entyvio now every 8 weeks, appears to be tolerating it well.  For anal fissure I had asked him to take diltiazem/lidocaine ointment.  He saw colorectal surgery for his history of perianal fistula, he has never had an abscess, held off on seton placement.  I had him on a course of Flagyl as well for period of time for the perianal fistulas.   Generally he is doing better than when I saw him a few months ago.  His rectal pain is much better, he only has it with bowel movements, previously it was much more frequent in interfered with his ability to work etc.  He still has some rectal discomfort however with his bowel movements.  He has not been using diltiazem/lidocaine ointment routinely.  He has been constipated, and having only 2-3 bowel movements per week.  He was told to take MiraLAX routinely, has been taking it only occasionally.  When taking it he was using it every other day, he is fearful of having bowel movements to make his pain worse.  He is also does not like having any urgency and wants to have a bowel movement without interfering with his work etc.  He is not  having much of any rectal bleeding.   He had blood work in January showing his hemoglobin improved to 14.4, his iron studies were normal.  He was severely vitamin D deficient and has since been placed on vitamin D supplementation.   Recall his fecal calprotectin was 3660 back in December 15 prior to therapy.  He has not had a follow-up calprotectin yet.   Prior workup: Colonoscopy 09/30/20 - active left sided colitis on biopsies- moderate   Colonoscopy 01/18/2022 - Dr. Jacqlyn [name] - active inflammation from anal margin to 20cm from anal verge - May score 1, improved from prior exam reportedly. Normal ileum, cecal patch noted. Poor prep noted.   Path shows normal right / transverse colon, mildly active left sided disease     MRI pelvis 03/14/22: IMPRESSION: Simple intersphincteric perianal fistula originating at the 6 o'clock position of the inferior anal sphincter, with extension in the subcutaneous tissues along the left side of the gluteal crease. No evidence of abscess.   Fecal calprotectin 3660 - 03/16/22   1st Enyvio  infusion 03/21/22    21 y.o. male here for assessment of the following   1. Crohn's disease of large intestine with other complication (HCC)   2. Perianal fistula   3. Anal fissure   4. Other constipation   5. Vitamin D deficiency  As above, Crohn's colitis with perianal fistula and anal fissure in the setting of chronic constipation.  He is definitely improved compared to when I have seen him previously.  Bradley [name] is working I believe, his anemia and iron deficiency has resolved.  While his rectal pain is significantly improved on the regimen, it has not resolved entirely.  He has ongoing constipation but is very hesitant to take anything for this.  I counseled him that we need to keep his bowels moving more routinely and keep his stools soft to prevent worsening of the fissure.  I recommend he take MiraLAX daily and titrate up or down as needed.  If he wants to  try something more aggressive to cause a bowel movement when he is better able to, I gave him some Linzess samples to see if that will help although discussed this is much more aggressive than the Miralax.  He inquires about dietary strategies and we discussed that for a bit.  He needs to use the diltiazem/lidocaine ointment more frequently to treat the anal fissure, I think that has helped him but he needs to use it more routinely over time to help heal the fissure and treat his pain. No symptoms concerning for abscess at this time.    He agrees with the regimen.  In the interim we will continue Entyvio q 8 weeks for now.  I will send him back to the lab for CBC and iron studies as he wants to make sure his anemia is stable.  I will also recheck a vitamin D level to make sure supplementation is working.  I will check a fecal calprotectin to make sure he is had an appropriate response.  If this remains quite elevated, may need to consider increasing Entyvio dosing, given clinical improvement hopefully this is downtrending nicely.   PLAN: - continue Entyvio at present dosing - lab for fecal calprotectin - lab for CBC, TIBC / ferritin, Vitamin D - Miralax daily / titrate up as needed or use of alternative - samples of Linzess and to use PRN. Samples of Miralax packets given as well - diltiazem / lidocaine ointment three times daily - stop colace - may consider increase in Entyvio dosing if fecal calprotectin remains high - f/u in 3 months     Fecal calprotectin 07/13/22 - 228  Increased Entyvio to every 4 week dosing  Fecal calprotectin 09811 - 240     Past Medical History:  Diagnosis Date   ADHD (attention deficit hyperactivity disorder)    Anal fissure    Anemia    Anxiety    Chronic constipation    Depression    Encopresis(307.7)    Growth hormone deficiency (HCC)    GSE (gluten-sensitive enteropathy)    Inflammatory bowel disease    Lactose intolerance    Mood  disorder (HCC)    Psoriasis    Scoliosis    Short stature    Solitary rectal ulcer syndrome    Ulcerative pancolitis with other complication [facility])      Past Surgical History:  Procedure Laterality Date   ADENOIDECTOMY     COLONOSCOPY N/A 12/27/2015   Procedure: COLONOSCOPY;  Surgeon: Adelene Amas, MD;  Location: [facility] ENDOSCOPY;  Service: Gastroenterology;  Laterality: N/A;   ESOPHAGOGASTRODUODENOSCOPY N/A 12/27/2015   Procedure: ESOPHAGOGASTRODUODENOSCOPY (EGD);  Surgeon: Adelene Amas, MD;  Location: [facility] ENDOSCOPY;  Service: Gastroenterology;  Laterality: N/A;   ESOPHAGOGASTRODUODENOSCOPY N/A 08/03/2016   Procedure: ESOPHAGOGASTRODUODENOSCOPY (EGD);  Surgeon: Adelene Amas, MD;  Location: [facility] ENDOSCOPY;  Service: Gastroenterology;  Laterality: N/A;   FLEXIBLE SIGMOIDOSCOPY  08/03/2016   Procedure: FLEXIBLE SIGMOIDOSCOPY;  Surgeon: Adelene Amas, MD;  Location: [facility] ENDOSCOPY;  Service: Gastroenterology;;   RECTOPEXY  01/19/2021   laproscopy, rectopexy for Prolapse, sigmoidoscopy, flex  ( Dr. Terri Skains)   tubes and adenoids     Family History  Problem Relation Age of Onset   Depression Mother    Miscarriages / [place] Mother    Depression Maternal Aunt    Hyperlipidemia Maternal Grandmother    Depression Maternal Grandmother    Cancer Maternal Grandfather        prostate   Hyperlipidemia Maternal Grandfather    Hypertension Maternal Grandfather    Ulcerative colitis Maternal Grandfather    Hyperlipidemia Paternal Grandmother    Thyroid disease Paternal Grandmother    Cancer Paternal Grandmother    Diabetes Paternal Grandfather    Hyperlipidemia Paternal Grandfather    Cancer Paternal Grandfather    Thyroid disease Paternal Grandfather    Celiac disease Neg Hx    Hirschsprung's disease Neg Hx    Colon cancer Neg Hx    Esophageal cancer Neg Hx    Pancreatic cancer Neg Hx    Stomach cancer Neg Hx    Social History   Tobacco Use   Smoking status: Never    Smokeless tobacco: Never  Vaping Use   Vaping status: Never Used  Substance Use Topics   Alcohol use: No   Drug use: No   Current Outpatient Medications  Medication Sig Dispense Refill   AMBULATORY NON FORMULARY MEDICATION Diltiazem gel with 5% lidocaine  Apply a pea sized amount into your rectum three times daily as needed 30 g 0   benzonatate (TESSALON) 200 MG capsule Take 200 mg by mouth 3 (three) times daily as needed.     Cholecalciferol (VITAMIN D-3) 125 MCG (5000 UT) TABS Take 5,000 Units by mouth daily. 30 tablet    escitalopram (LEXAPRO) 20 MG tablet Take 30 mg by mouth daily.      ferrous sulfate 325 (65 FE) MG tablet Take 325 mg by mouth daily with breakfast.     lamoTRIgine (LAMICTAL) 150 MG tablet Take 1 tablet (150 mg total) by mouth 2 (two) times daily. 60 tablet 1   linaclotide (LINZESS) 145 MCG CAPS capsule Take 1 capsule (145 mcg total) by mouth daily before breakfast. 10-2022 exp, Lot [id number] 8 capsule 0   linaclotide (LINZESS) 72 MCG capsule Take 1 capsule (72 mcg total) by mouth daily before breakfast. 20-26 exp. Lot: [id number] 12 capsule 0   lisdexamfetamine (VYVANSE) 20 MG capsule Take 170 mg by mouth daily.     lisdexamfetamine (VYVANSE) 50 MG capsule Take 50 mg by mouth daily.     polyethylene glycol (MIRALAX) 17 g packet Take 17 g by mouth daily. Titrate as needed 14 each 0   traZODone (DESYREL) 50 MG tablet Take 75 mg by mouth at bedtime.     Vedolizumab (ENTYVIO IV) Inject into the vein.     No current facility-administered medications for this visit.   Allergies  Allergen Reactions   Trileptal [Oxcarbazepine] Rash     Review of Systems: All systems reviewed and negative except where noted in HPI.    No results found.  Physical Exam: There were no vitals taken for this visit. Constitutional: Pleasant,well-developed, ***male in no acute distress. HEENT: Normocephalic and atraumatic. Conjunctivae are normal. No scleral icterus. Neck supple.   Cardiovascular: Normal rate, regular  rhythm.  Pulmonary/chest: Effort normal and breath sounds normal. No wheezing, rales or rhonchi. Abdominal: Soft, nondistended, nontender. Bowel sounds active throughout. There are no masses palpable. No hepatomegaly. Extremities: no edema Lymphadenopathy: No cervical adenopathy noted. Neurological: Alert and oriented to person place and time. Skin: Skin is warm and dry. No rashes noted. Psychiatric: Normal mood and affect. Behavior is normal.   ASSESSMENT: 22 y.o. male here for assessment of the following  No diagnosis found.  PLAN:   Marcello Fennel Au*

## 2022-12-26 ENCOUNTER — Encounter: Payer: Self-pay | Admitting: Gastroenterology

## 2022-12-26 ENCOUNTER — Ambulatory Visit (INDEPENDENT_AMBULATORY_CARE_PROVIDER_SITE_OTHER): Payer: 59 | Admitting: Gastroenterology

## 2022-12-26 VITALS — BP 118/68 | HR 113 | Ht 63.0 in | Wt 106.4 lb

## 2022-12-26 DIAGNOSIS — K50118 Crohn's disease of large intestine with other complication: Secondary | ICD-10-CM | POA: Diagnosis not present

## 2022-12-26 DIAGNOSIS — K5909 Other constipation: Secondary | ICD-10-CM

## 2022-12-26 DIAGNOSIS — F902 Attention-deficit hyperactivity disorder, combined type: Secondary | ICD-10-CM | POA: Diagnosis not present

## 2022-12-26 DIAGNOSIS — F411 Generalized anxiety disorder: Secondary | ICD-10-CM | POA: Diagnosis not present

## 2022-12-26 DIAGNOSIS — F3181 Bipolar II disorder: Secondary | ICD-10-CM | POA: Diagnosis not present

## 2022-12-26 DIAGNOSIS — K602 Anal fissure, unspecified: Secondary | ICD-10-CM

## 2022-12-26 DIAGNOSIS — G4700 Insomnia, unspecified: Secondary | ICD-10-CM | POA: Diagnosis not present

## 2022-12-26 DIAGNOSIS — K603 Anal fistula, unspecified: Secondary | ICD-10-CM

## 2022-12-26 MED ORDER — NA SULFATE-K SULFATE-MG SULF 17.5-3.13-1.6 GM/177ML PO SOLN: 1.0000 | Freq: Once | ORAL | 0 refills | Status: AC

## 2022-12-26 NOTE — Patient Instructions (Addendum)
If your blood pressure at your visit was 140/90 or greater, please contact your primary care physician to follow up on this. ______________________________________________________  If you are age 23 or older, your body mass index should be between 23-30. Your Body mass index is 18.85 kg/m. If this is out of the aforementioned range listed, please consider follow up with your Primary Care Provider.  If you are age 94 or younger, your body mass index should be between 19-25. Your Body mass index is 18.85 kg/m. If this is out of the aformentioned range listed, please consider follow up with your Primary Care Provider.  ________________________________________________________  The [facility] providers would like to encourage you to use [facility] to communicate with providers for non-urgent requests or questions.  Due to long hold times on the telephone, sending your provider a message by [facility] may be a faster and more efficient way to get a response.  Please allow 48 business hours for a response.  Please remember that this is for non-urgent requests.  _______________________________________________________  Due to recent changes in healthcare laws, you may see the results of your imaging and laboratory studies on MyChart before your provider has had a chance to review them.  We understand that in some cases there may be results that are confusing or concerning to you. Not all laboratory results come back in the same time frame and the provider may be waiting for multiple results in order to interpret others.  Please give [place] 48 hours in order for your provider to thoroughly review all the results before contacting the office for clarification of your results.    You have been scheduled for a colonoscopy on Monday 9-30 with Dr. Adela Lank. Please follow written instructions given to you at your visit today.  Please pick up your prep supplies at the pharmacy within the next 1-3 days. If you use inhalers  (even only as needed), please bring them with you on the day of your procedure.  DO NOT TAKE 7 DAYS PRIOR TO TEST- Trulicity (dulaglutide) Ozempic, Wegovy (semaglutide) Mounjaro (tirzepatide) Bydureon Bcise (exanatide extended release)  DO NOT TAKE 1 DAY PRIOR TO YOUR TEST Rybelsus (semaglutide) Adlyxin (lixisenatide) Victoza (liraglutide) Byetta (exanatide) ________________________________________________________________  Take Miralax daily prior to the procedure, starting today.  The morning before the procedure, please take Linzess 145 mg before breakfast.  Thank you for entrusting me with your care and for choosing [facility], Dr. Ileene Patrick     ___________________________________________________________________________

## 2022-12-27 DIAGNOSIS — F331 Major depressive disorder, recurrent, moderate: Secondary | ICD-10-CM | POA: Diagnosis not present

## 2022-12-31 ENCOUNTER — Encounter: Payer: Self-pay | Admitting: Gastroenterology

## 2022-12-31 ENCOUNTER — Ambulatory Visit (AMBULATORY_SURGERY_CENTER): Payer: 59 | Admitting: Gastroenterology

## 2022-12-31 VITALS — BP 106/73 | HR 98 | Temp 98.9°F | Resp 16 | Ht 63.0 in | Wt 106.0 lb

## 2022-12-31 DIAGNOSIS — Z8719 Personal history of other diseases of the digestive system: Secondary | ICD-10-CM | POA: Diagnosis not present

## 2022-12-31 DIAGNOSIS — K50118 Crohn's disease of large intestine with other complication: Secondary | ICD-10-CM

## 2022-12-31 MED ORDER — HYDROCORTISONE ACETATE 25 MG RE SUPP: 25.0000 mg | Freq: Every day | RECTAL | 1 refills | Status: AC

## 2022-12-31 MED ORDER — SODIUM CHLORIDE 0.9 % IV SOLN: 500.0000 mL | Freq: Once | INTRAVENOUS | Status: DC

## 2022-12-31 NOTE — Patient Instructions (Signed)
YOU HAD AN ENDOSCOPIC PROCEDURE TODAY AT THE [facility]:   Refer to the procedure report that was given to you for any specific questions about what was found during the examination.  If the procedure report does not answer your questions, please call your gastroenterologist to clarify.  If you requested that your care partner not be given the details of your procedure findings, then the procedure report has been included in a sealed envelope for you to review at your convenience later.  YOU SHOULD EXPECT: Some feelings of bloating in the abdomen. Passage of more gas than usual.  Walking can help get rid of the air that was put into your GI tract during the procedure and reduce the bloating. If you had a lower endoscopy (such as a colonoscopy or flexible sigmoidoscopy) you may notice spotting of blood in your stool or on the toilet paper. If you underwent a bowel prep for your procedure, you may not have a normal bowel movement for a few days.  Please Note:  You might notice some irritation and congestion in your nose or some drainage.  This is from the oxygen used during your procedure.  There is no need for concern and it should clear up in a day or so.  SYMPTOMS TO REPORT IMMEDIATELY:  Following lower endoscopy (colonoscopy or flexible sigmoidoscopy):  Excessive amounts of blood in the stool  Significant tenderness or worsening of abdominal pains  Swelling of the abdomen that is new, acute  Fever of 100F or higher  For urgent or emergent issues, a gastroenterologist can be reached at any hour by calling [phone]. Do not use MyChart messaging for urgent concerns.    DIET:  We do recommend a small meal at first, but then you may proceed to your regular diet.  Drink plenty of fluids but you should avoid alcoholic beverages for 24 hours.  ACTIVITY:  You should plan to take it easy for the rest of today and you should NOT DRIVE or use heavy machinery until tomorrow (because of  the sedation medicines used during the test).    FOLLOW UP: Our staff will call the number listed on your records the next business day following your procedure.  We will call around 7:15- 8:00 am to check on you and address any questions or concerns that you may have regarding the information given to you following your procedure. If we do not reach you, we will leave a message.     If any biopsies were taken you will be contacted by phone or by letter within the next 1-3 weeks.  Please call us at [phone] if you have not heard about the biopsies in 3 weeks.    SIGNATURES/CONFIDENTIALITY: You and/or your care partner have signed paperwork which will be entered into your electronic medical record.  These signatures attest to the fact that that the information above on your After Visit Summary has been reviewed and is understood.  Full responsibility of the confidentiality of this discharge information lies with you and/or your care-partner.  

## 2022-12-31 NOTE — Progress Notes (Signed)
Called to room to assist during endoscopic procedure.  Patient ID and intended procedure confirmed with present staff. Received instructions for my participation in the procedure from the performing physician.  

## 2022-12-31 NOTE — Progress Notes (Signed)
History and Physical Interval Note: see office note from 9/25. NO interval changes. Colonoscopy to survey Crohn's disease and ongoing rectal pain. He is on Entyvio with improvement but not resolution. Fecal calprotectin in the 200s. Colonoscopy to further evaluate.  12/31/2022 10:41 AM  Bradley [name]  has presented today for endoscopic procedure(s), with the diagnosis of  Encounter Diagnosis  Name Primary?   Crohn's disease of large intestine with other complication (HCC) Yes  .  The various methods of evaluation and treatment have been discussed with the patient and/or family. After consideration of risks, benefits and other options for treatment, the patient has consented to  the endoscopic procedure(s).   The patient's history has been reviewed, patient examined, no change in status, stable for surgery.  I have reviewed the patient's chart and labs.  Questions were answered to the patient's satisfaction.    Harlin Rain, MD [facility]

## 2022-12-31 NOTE — Op Note (Signed)
[facility] Patient Name: Bradley [name] Procedure Date: 12/31/2022 10:41 AM MRN: [id number] Endoscopist: Viviann Spare P. Adela Lank , MD, [id number] Age: 22 Referring MD:  Date of Birth: Apr 25, 2000 Gender: Male Account #: [id number] Procedure:                Colonoscopy Indications:              Disease activity assessment of Crohn's disease of                            the colon - history of perianal fistula and chronic                            rectal pain. Failed Humira Tyrell Antonio, now on Entvio                            with improvement, reduction in fecal calprotectin,                            but ongoing rectal symptoms. History of pelvic MRI                            showing fistula previously, has seen colorectal                            surgery Medicines:                Monitored Anesthesia Care Procedure:                Pre-Anesthesia Assessment:                           - Prior to the procedure, a History and Physical                            was performed, and patient medications and                            allergies were reviewed. The patient's tolerance of                            previous anesthesia was also reviewed. The risks                            and benefits of the procedure and the sedation                            options and risks were discussed with the patient.                            All questions were answered, and informed consent                            was obtained. Prior Anticoagulants: The patient has  taken no anticoagulant or antiplatelet agents. ASA                            Grade Assessment: III - A patient with severe                            systemic disease. After reviewing the risks and                            benefits, the patient was deemed in satisfactory                            condition to undergo the procedure.                           After obtaining informed consent,  the colonoscope                            was passed under direct vision. Throughout the                            procedure, the patient's blood pressure, pulse, and                            oxygen saturations were monitored continuously. The                            PCF-H190TL Slim SN [id number] was introduced through                            the anus and advanced to the the terminal ileum,                            with identification of the appendiceal orifice and                            IC valve. The colonoscopy was performed without                            difficulty. The patient tolerated the procedure                            well. The quality of the bowel preparation was                            adequate. The terminal ileum, ileocecal valve,                            appendiceal orifice, and rectum were photographed. Scope In: 10:50:40 AM Scope Out: 11:18:16 AM Scope Withdrawal Time: 0 hours 16 minutes 17 seconds  Total Procedure Duration: 0 hours 27 minutes 36 seconds  Findings:                 Hemorrhoids were found on perianal exam. Large /  inflamed and prolapse but able to be reduced (grade                            III). No obvious fistulas noted. Some superficial                            ulceration overlying hemorrhoids noted.                           The terminal ileum appeared normal.                           A large amount of liquid stool was found in the                            entire colon, making visualization difficult.                            Lavage of the colon was performed using copious                            amounts, resulting in clearance with mostly                            adequate visualization. Some areas of the dependant                            portion of the colon were hard to clear entirely                            and some obscured views in those areas. Several                             minutes spent lavaging the colon to achieve                            adequate views.                           Internal hemorrhoids were found during                            retroflexion. Scarring of the distal rectum noted                            on retroflexed views but no obvious active                            inflammation.                           The exam was otherwise without abnormality. No                            active inflammation.  Biopsies were taken with a cold forceps for                            histology. Complications:            No immediate complications. Estimated blood loss:                            Minimal. Estimated Blood Loss:     Estimated blood loss was minimal. Impression:               - Grade III hemorrhoids found on perianal exam and                            were reduced. Superficial ulceration overlying                            hemorrhoids.                           - The examined portion of the ileum was normal.                           - Stool in the entire examined colon leading to                            extensive lavage as outlined.                           - Internal hemorrhoids.                           - The examination was otherwise normal.                           - Biopsies were taken with a cold forceps for                            histology.                           No active inflammation in the colon on Entyvio                            which is excellent news. Biopsies taken. Rectal                            symptoms appear to be coming from large inflamed                            hemorrhoids with superficial ulceration. Recommendation:           - Patient has a contact number available for                            emergencies. The signs and symptoms of potential  delayed complications were discussed with the                            patient. Return  to normal activities tomorrow.                            Written discharge instructions were provided to the                            patient.                           - Resume previous diet.                           - Continue present medications.                           - Continue Entyvio for now                           - Await pathology results.                           - Would try adding Anusol suppository daily for 10                            days and see if this helps hemorroidal symptoms                           - Add Calmol4 suppositories as needed                           - Continue bowel regimen for constipation                           - If rectal pain persists consider repeat MRI                            pelvis and colorectal surgery evaluation Ferdie Bakken P. Yesmin Mutch, MD 12/31/2022 11:32:38 AM This report has been signed electronically.

## 2022-12-31 NOTE — Progress Notes (Signed)
Vss nad trans to pacu 

## 2022-12-31 NOTE — Progress Notes (Signed)
Pt's states no medical or surgical changes since previsit or office visit. VS assessed by K.P ?

## 2023-01-01 ENCOUNTER — Other Ambulatory Visit: Payer: Self-pay

## 2023-01-01 ENCOUNTER — Telehealth: Payer: Self-pay

## 2023-01-01 MED ORDER — CALMOL-4 76-10 % RE SUPP: RECTAL | Status: AC

## 2023-01-01 NOTE — Telephone Encounter (Signed)
  Follow up Call-     12/31/2022   10:09 AM  Call back number  Post procedure Call Back phone  # [phone]  Permission to leave phone message Yes     Patient questions:  Do you have a fever, pain , or abdominal swelling? No. Pain Score  0 *  Have you tolerated food without any problems? Yes.    Have you been able to return to your normal activities? Yes.    Do you have any questions about your discharge instructions: Diet   No. Medications  No. Follow up visit  No.  Do you have questions or concerns about your Care? No.  Actions: * If pain score is 4 or above: No action needed, pain <4.

## 2023-01-02 ENCOUNTER — Encounter: Payer: Self-pay | Admitting: Gastroenterology

## 2023-01-02 LAB — SURGICAL PATHOLOGY

## 2023-01-03 DIAGNOSIS — F331 Major depressive disorder, recurrent, moderate: Secondary | ICD-10-CM | POA: Diagnosis not present

## 2023-01-08 DIAGNOSIS — Z23 Encounter for immunization: Secondary | ICD-10-CM | POA: Diagnosis not present

## 2023-01-08 DIAGNOSIS — M4185 Other forms of scoliosis, thoracolumbar region: Secondary | ICD-10-CM | POA: Diagnosis not present

## 2023-01-09 DIAGNOSIS — F902 Attention-deficit hyperactivity disorder, combined type: Secondary | ICD-10-CM | POA: Diagnosis not present

## 2023-01-09 DIAGNOSIS — F3181 Bipolar II disorder: Secondary | ICD-10-CM | POA: Diagnosis not present

## 2023-01-09 DIAGNOSIS — F411 Generalized anxiety disorder: Secondary | ICD-10-CM | POA: Diagnosis not present

## 2023-01-09 DIAGNOSIS — G4700 Insomnia, unspecified: Secondary | ICD-10-CM | POA: Diagnosis not present

## 2023-01-10 DIAGNOSIS — F331 Major depressive disorder, recurrent, moderate: Secondary | ICD-10-CM | POA: Diagnosis not present

## 2023-01-11 ENCOUNTER — Ambulatory Visit: Payer: 59

## 2023-01-11 VITALS — BP 121/80 | HR 108 | Temp 98.4°F | Resp 18 | Ht 63.5 in | Wt 104.6 lb

## 2023-01-11 DIAGNOSIS — M4185 Other forms of scoliosis, thoracolumbar region: Secondary | ICD-10-CM | POA: Diagnosis not present

## 2023-01-11 DIAGNOSIS — K50113 Crohn's disease of large intestine with fistula: Secondary | ICD-10-CM

## 2023-01-11 MED ORDER — VEDOLIZUMAB 300 MG IV SOLR
300.0000 mg | Freq: Once | INTRAVENOUS | Status: AC
  Administered 2023-01-11: 300 mg via INTRAVENOUS
  Filled 2023-01-11: qty 5

## 2023-01-11 NOTE — Progress Notes (Signed)
Diagnosis: Crohn's Disease  Provider:  Chilton Greathouse MD  Procedure: IV Infusion  IV Type: Peripheral, IV Location: L Antecubital  Entyvio (Vedolizumab), Dose: 300 mg  Infusion Start Time: 1016  Infusion Stop Time: 1047  Post Infusion IV Care: Peripheral IV Discontinued  Discharge: Condition: Good, Destination: Home . AVS Declined  Performed by:  Rico Ala, LPN

## 2023-01-14 ENCOUNTER — Other Ambulatory Visit: Payer: Self-pay

## 2023-01-14 ENCOUNTER — Encounter: Payer: Self-pay | Admitting: Gastroenterology

## 2023-01-14 MED ORDER — LINACLOTIDE 72 MCG PO CAPS: 72.0000 ug | ORAL_CAPSULE | Freq: Every day | ORAL | 2 refills | Status: DC

## 2023-01-14 MED ORDER — LINACLOTIDE 72 MCG PO CAPS: 72.0000 ug | ORAL_CAPSULE | Freq: Every day | ORAL | 1 refills | Status: DC

## 2023-01-14 NOTE — Addendum Note (Signed)
Addended by: Richardson Chiquito on: 01/14/2023 05:14 PM   Modules accepted: Orders

## 2023-01-15 DIAGNOSIS — M4185 Other forms of scoliosis, thoracolumbar region: Secondary | ICD-10-CM | POA: Diagnosis not present

## 2023-01-17 DIAGNOSIS — F331 Major depressive disorder, recurrent, moderate: Secondary | ICD-10-CM | POA: Diagnosis not present

## 2023-01-18 DIAGNOSIS — M4185 Other forms of scoliosis, thoracolumbar region: Secondary | ICD-10-CM | POA: Diagnosis not present

## 2023-01-22 DIAGNOSIS — M4185 Other forms of scoliosis, thoracolumbar region: Secondary | ICD-10-CM | POA: Diagnosis not present

## 2023-01-25 DIAGNOSIS — M4185 Other forms of scoliosis, thoracolumbar region: Secondary | ICD-10-CM | POA: Diagnosis not present

## 2023-01-28 DIAGNOSIS — F331 Major depressive disorder, recurrent, moderate: Secondary | ICD-10-CM | POA: Diagnosis not present

## 2023-01-29 DIAGNOSIS — M4185 Other forms of scoliosis, thoracolumbar region: Secondary | ICD-10-CM | POA: Diagnosis not present

## 2023-01-30 DIAGNOSIS — F3181 Bipolar II disorder: Secondary | ICD-10-CM | POA: Diagnosis not present

## 2023-01-30 DIAGNOSIS — G4700 Insomnia, unspecified: Secondary | ICD-10-CM | POA: Diagnosis not present

## 2023-01-30 DIAGNOSIS — F902 Attention-deficit hyperactivity disorder, combined type: Secondary | ICD-10-CM | POA: Diagnosis not present

## 2023-01-30 DIAGNOSIS — F411 Generalized anxiety disorder: Secondary | ICD-10-CM | POA: Diagnosis not present

## 2023-01-31 DIAGNOSIS — F331 Major depressive disorder, recurrent, moderate: Secondary | ICD-10-CM | POA: Diagnosis not present

## 2023-02-01 DIAGNOSIS — M4185 Other forms of scoliosis, thoracolumbar region: Secondary | ICD-10-CM | POA: Diagnosis not present

## 2023-02-05 DIAGNOSIS — R7989 Other specified abnormal findings of blood chemistry: Secondary | ICD-10-CM | POA: Diagnosis not present

## 2023-02-05 DIAGNOSIS — R6889 Other general symptoms and signs: Secondary | ICD-10-CM | POA: Diagnosis not present

## 2023-02-05 DIAGNOSIS — E063 Autoimmune thyroiditis: Secondary | ICD-10-CM | POA: Diagnosis not present

## 2023-02-05 DIAGNOSIS — M4185 Other forms of scoliosis, thoracolumbar region: Secondary | ICD-10-CM | POA: Diagnosis not present

## 2023-02-05 DIAGNOSIS — Z79899 Other long term (current) drug therapy: Secondary | ICD-10-CM | POA: Diagnosis not present

## 2023-02-07 DIAGNOSIS — F331 Major depressive disorder, recurrent, moderate: Secondary | ICD-10-CM | POA: Diagnosis not present

## 2023-02-08 DIAGNOSIS — M4185 Other forms of scoliosis, thoracolumbar region: Secondary | ICD-10-CM | POA: Diagnosis not present

## 2023-02-11 DIAGNOSIS — M4185 Other forms of scoliosis, thoracolumbar region: Secondary | ICD-10-CM | POA: Diagnosis not present

## 2023-02-14 DIAGNOSIS — F331 Major depressive disorder, recurrent, moderate: Secondary | ICD-10-CM | POA: Diagnosis not present

## 2023-02-15 DIAGNOSIS — M4185 Other forms of scoliosis, thoracolumbar region: Secondary | ICD-10-CM | POA: Diagnosis not present

## 2023-02-18 DIAGNOSIS — Z681 Body mass index (BMI) 19 or less, adult: Secondary | ICD-10-CM | POA: Diagnosis not present

## 2023-02-18 DIAGNOSIS — M25512 Pain in left shoulder: Secondary | ICD-10-CM | POA: Diagnosis not present

## 2023-02-19 DIAGNOSIS — M4185 Other forms of scoliosis, thoracolumbar region: Secondary | ICD-10-CM | POA: Diagnosis not present

## 2023-02-21 DIAGNOSIS — F331 Major depressive disorder, recurrent, moderate: Secondary | ICD-10-CM | POA: Diagnosis not present

## 2023-02-22 ENCOUNTER — Ambulatory Visit (INDEPENDENT_AMBULATORY_CARE_PROVIDER_SITE_OTHER): Payer: 59

## 2023-02-22 VITALS — BP 108/66 | HR 91 | Temp 98.6°F | Resp 14 | Ht 63.0 in | Wt 103.6 lb

## 2023-02-22 DIAGNOSIS — K50113 Crohn's disease of large intestine with fistula: Secondary | ICD-10-CM | POA: Diagnosis not present

## 2023-02-22 DIAGNOSIS — M4185 Other forms of scoliosis, thoracolumbar region: Secondary | ICD-10-CM | POA: Diagnosis not present

## 2023-02-22 MED ORDER — SODIUM CHLORIDE 0.9 % IV SOLN
300.0000 mg | Freq: Once | INTRAVENOUS | Status: AC
  Administered 2023-02-22: 300 mg via INTRAVENOUS
  Filled 2023-02-22: qty 5

## 2023-02-22 NOTE — Progress Notes (Signed)
Diagnosis: Crohn's colitis   Provider:  Chilton Greathouse MD  Procedure: IV Infusion  IV Type: Peripheral, IV Location: L Antecubital  Entyvio (Vedolizumab), Dose: 300 mg  Infusion Start Time: 1420  Infusion Stop Time: 1454  Post Infusion IV Care: Patient declined observation and Peripheral IV Discontinued  Discharge: Condition: Stable, Destination: Home . AVS Declined  Performed by:  Wyvonne Lenz, RN

## 2023-03-07 DIAGNOSIS — F331 Major depressive disorder, recurrent, moderate: Secondary | ICD-10-CM | POA: Diagnosis not present

## 2023-03-07 DIAGNOSIS — F3181 Bipolar II disorder: Secondary | ICD-10-CM | POA: Diagnosis not present

## 2023-03-07 DIAGNOSIS — G4700 Insomnia, unspecified: Secondary | ICD-10-CM | POA: Diagnosis not present

## 2023-03-07 DIAGNOSIS — F411 Generalized anxiety disorder: Secondary | ICD-10-CM | POA: Diagnosis not present

## 2023-03-07 DIAGNOSIS — F902 Attention-deficit hyperactivity disorder, combined type: Secondary | ICD-10-CM | POA: Diagnosis not present

## 2023-03-08 DIAGNOSIS — M4185 Other forms of scoliosis, thoracolumbar region: Secondary | ICD-10-CM | POA: Diagnosis not present

## 2023-03-12 DIAGNOSIS — M4185 Other forms of scoliosis, thoracolumbar region: Secondary | ICD-10-CM | POA: Diagnosis not present

## 2023-03-13 DIAGNOSIS — M7542 Impingement syndrome of left shoulder: Secondary | ICD-10-CM | POA: Diagnosis not present

## 2023-03-14 DIAGNOSIS — F331 Major depressive disorder, recurrent, moderate: Secondary | ICD-10-CM | POA: Diagnosis not present

## 2023-03-15 DIAGNOSIS — M4185 Other forms of scoliosis, thoracolumbar region: Secondary | ICD-10-CM | POA: Diagnosis not present

## 2023-03-19 DIAGNOSIS — M4185 Other forms of scoliosis, thoracolumbar region: Secondary | ICD-10-CM | POA: Diagnosis not present

## 2023-03-21 DIAGNOSIS — F331 Major depressive disorder, recurrent, moderate: Secondary | ICD-10-CM | POA: Diagnosis not present

## 2023-03-22 DIAGNOSIS — M4185 Other forms of scoliosis, thoracolumbar region: Secondary | ICD-10-CM | POA: Diagnosis not present

## 2023-03-22 DIAGNOSIS — M7542 Impingement syndrome of left shoulder: Secondary | ICD-10-CM | POA: Diagnosis not present

## 2023-04-01 ENCOUNTER — Encounter: Payer: Self-pay | Admitting: Gastroenterology

## 2023-04-02 DIAGNOSIS — M7542 Impingement syndrome of left shoulder: Secondary | ICD-10-CM | POA: Diagnosis not present

## 2023-04-02 DIAGNOSIS — M4185 Other forms of scoliosis, thoracolumbar region: Secondary | ICD-10-CM | POA: Diagnosis not present

## 2023-04-05 ENCOUNTER — Ambulatory Visit: Payer: 59

## 2023-04-05 VITALS — BP 114/74 | HR 106 | Temp 98.5°F | Resp 18 | Ht 63.5 in | Wt 101.8 lb

## 2023-04-05 DIAGNOSIS — K50113 Crohn's disease of large intestine with fistula: Secondary | ICD-10-CM

## 2023-04-05 DIAGNOSIS — M4185 Other forms of scoliosis, thoracolumbar region: Secondary | ICD-10-CM | POA: Diagnosis not present

## 2023-04-05 DIAGNOSIS — M7542 Impingement syndrome of left shoulder: Secondary | ICD-10-CM | POA: Diagnosis not present

## 2023-04-05 MED ORDER — VEDOLIZUMAB 300 MG IV SOLR
300.0000 mg | Freq: Once | INTRAVENOUS | Status: AC
Start: 2023-04-05 — End: 2023-04-05
  Administered 2023-04-05: 300 mg via INTRAVENOUS
  Filled 2023-04-05: qty 5

## 2023-04-05 NOTE — Progress Notes (Signed)
 Diagnosis: Crohn's Disease  Provider:  Praveen Mannam MD  Procedure: IV Infusion  IV Type: Peripheral, IV Location: L Forearm  Entyvio  (Vedolizumab ), Dose: 300 mg  Infusion Start Time: 1138  Infusion Stop Time: 1215  Post Infusion IV Care: Peripheral IV Discontinued  Discharge: Condition: Good, Destination: Home . AVS Declined  Performed by:  Donny Childes, RN

## 2023-04-09 DIAGNOSIS — M7542 Impingement syndrome of left shoulder: Secondary | ICD-10-CM | POA: Diagnosis not present

## 2023-04-09 DIAGNOSIS — M4185 Other forms of scoliosis, thoracolumbar region: Secondary | ICD-10-CM | POA: Diagnosis not present

## 2023-04-11 ENCOUNTER — Encounter: Payer: Self-pay | Admitting: Gastroenterology

## 2023-04-11 DIAGNOSIS — F4321 Adjustment disorder with depressed mood: Secondary | ICD-10-CM | POA: Diagnosis not present

## 2023-04-11 DIAGNOSIS — F331 Major depressive disorder, recurrent, moderate: Secondary | ICD-10-CM | POA: Diagnosis not present

## 2023-04-12 DIAGNOSIS — M4185 Other forms of scoliosis, thoracolumbar region: Secondary | ICD-10-CM | POA: Diagnosis not present

## 2023-04-12 DIAGNOSIS — M7542 Impingement syndrome of left shoulder: Secondary | ICD-10-CM | POA: Diagnosis not present

## 2023-04-18 DIAGNOSIS — F4321 Adjustment disorder with depressed mood: Secondary | ICD-10-CM | POA: Diagnosis not present

## 2023-04-18 DIAGNOSIS — F331 Major depressive disorder, recurrent, moderate: Secondary | ICD-10-CM | POA: Diagnosis not present

## 2023-04-19 DIAGNOSIS — M4185 Other forms of scoliosis, thoracolumbar region: Secondary | ICD-10-CM | POA: Diagnosis not present

## 2023-04-19 DIAGNOSIS — M7542 Impingement syndrome of left shoulder: Secondary | ICD-10-CM | POA: Diagnosis not present

## 2023-04-25 DIAGNOSIS — F4321 Adjustment disorder with depressed mood: Secondary | ICD-10-CM | POA: Diagnosis not present

## 2023-04-25 DIAGNOSIS — F331 Major depressive disorder, recurrent, moderate: Secondary | ICD-10-CM | POA: Diagnosis not present

## 2023-04-26 DIAGNOSIS — M7542 Impingement syndrome of left shoulder: Secondary | ICD-10-CM | POA: Diagnosis not present

## 2023-04-26 DIAGNOSIS — M4185 Other forms of scoliosis, thoracolumbar region: Secondary | ICD-10-CM | POA: Diagnosis not present

## 2023-05-01 DIAGNOSIS — F411 Generalized anxiety disorder: Secondary | ICD-10-CM | POA: Diagnosis not present

## 2023-05-01 DIAGNOSIS — F902 Attention-deficit hyperactivity disorder, combined type: Secondary | ICD-10-CM | POA: Diagnosis not present

## 2023-05-01 DIAGNOSIS — G4700 Insomnia, unspecified: Secondary | ICD-10-CM | POA: Diagnosis not present

## 2023-05-01 DIAGNOSIS — F3181 Bipolar II disorder: Secondary | ICD-10-CM | POA: Diagnosis not present

## 2023-05-01 DIAGNOSIS — M25512 Pain in left shoulder: Secondary | ICD-10-CM | POA: Diagnosis not present

## 2023-05-02 DIAGNOSIS — F4321 Adjustment disorder with depressed mood: Secondary | ICD-10-CM | POA: Diagnosis not present

## 2023-05-07 ENCOUNTER — Telehealth: Payer: Self-pay | Admitting: Pharmacy Technician

## 2023-05-07 NOTE — Telephone Encounter (Signed)
 Auth Submission: PENDING Site of care: Site of care: [facility] Payer: [employer] Medication & CPT/J Code(s) submitted: Entyvio  (Vedolizumab ) [id number] Route of submission (phone, fax, portal):  Phone #[phone] Fax # [phone] Auth type: Buy/Bill PB Units/visits requested: 300MG  Q6WKS Reference number: [id number] Approval from:

## 2023-05-09 DIAGNOSIS — F4321 Adjustment disorder with depressed mood: Secondary | ICD-10-CM | POA: Diagnosis not present

## 2023-05-15 DIAGNOSIS — F331 Major depressive disorder, recurrent, moderate: Secondary | ICD-10-CM | POA: Diagnosis not present

## 2023-05-15 DIAGNOSIS — Z13220 Encounter for screening for lipoid disorders: Secondary | ICD-10-CM | POA: Diagnosis not present

## 2023-05-15 DIAGNOSIS — Z681 Body mass index (BMI) 19 or less, adult: Secondary | ICD-10-CM | POA: Diagnosis not present

## 2023-05-15 DIAGNOSIS — F411 Generalized anxiety disorder: Secondary | ICD-10-CM | POA: Diagnosis not present

## 2023-05-15 DIAGNOSIS — D509 Iron deficiency anemia, unspecified: Secondary | ICD-10-CM | POA: Diagnosis not present

## 2023-05-15 DIAGNOSIS — Z0000 Encounter for general adult medical examination without abnormal findings: Secondary | ICD-10-CM | POA: Diagnosis not present

## 2023-05-16 DIAGNOSIS — F4321 Adjustment disorder with depressed mood: Secondary | ICD-10-CM | POA: Diagnosis not present

## 2023-05-17 ENCOUNTER — Ambulatory Visit: Payer: 59

## 2023-05-17 VITALS — BP 116/74 | HR 108 | Temp 97.8°F | Resp 18 | Ht 63.0 in | Wt 100.6 lb

## 2023-05-17 DIAGNOSIS — K50113 Crohn's disease of large intestine with fistula: Secondary | ICD-10-CM | POA: Diagnosis not present

## 2023-05-17 MED ORDER — VEDOLIZUMAB 300 MG IV SOLR
300.0000 mg | Freq: Once | INTRAVENOUS | Status: AC
  Administered 2023-05-17: 300 mg via INTRAVENOUS
  Filled 2023-05-17: qty 5

## 2023-05-17 NOTE — Progress Notes (Signed)
Diagnosis: Crohn's Disease  Provider:  Chilton Greathouse MD  Procedure: IV Infusion  IV Type: Peripheral, IV Location: L Antecubital  Entyvio (Vedolizumab), Dose: 300 mg  Infusion Start Time: 1110  Infusion Stop Time: 1144  Post Infusion IV Care: Peripheral IV Discontinued  Discharge: Condition: Good, Destination: Home . AVS Declined  Performed by:  Adriana Mccallum, RN

## 2023-05-23 DIAGNOSIS — F4321 Adjustment disorder with depressed mood: Secondary | ICD-10-CM | POA: Diagnosis not present

## 2023-05-30 DIAGNOSIS — F4321 Adjustment disorder with depressed mood: Secondary | ICD-10-CM | POA: Diagnosis not present

## 2023-05-31 ENCOUNTER — Telehealth: Payer: Self-pay | Admitting: Pharmacy Technician

## 2023-05-31 NOTE — Telephone Encounter (Signed)
 Dr. Ellin Saba, / [place],   Elinor Parkinson auth has expired and will need a new auth.  It has been denied again for q6w dosing. We will have to go through the appeal process again. The denial has been faxed to his media tab for your review and instructions for the appeal.  PA# [id number] PHONE: [phone] FAX: [phone]  His next upcoming appt is 06/28/23.  Auth Submission: DENIED Site of care: Site of care: [facility] Payer: [employer] Medication & CPT/J Code(s) submitted: Entyvio (Vedolizumab) [phone] Route of submission (phone, fax, portal):  Phone # Fax # Auth type: Buy/Bill PB Units/visits requested: q6w dosing Reference number: [id number] Approval from:  to     Authorization has been DENIED because FOR Q6WK DOSING.

## 2023-06-03 NOTE — Telephone Encounter (Signed)
 Okay. This is frustrating to hear. He has done well on this regimen and colon healed on his last colonoscopy. Can someone help start this appeal process? I am not sure when his last infusion was and when he is due. Thanks

## 2023-06-03 NOTE — Telephone Encounter (Signed)
 Request for an appeal along with last office note, colonoscopy and pathology report, and previous approval for Entyvio 300 mg every 6 weeks have been faxed to [facility] appeals department at (P: [phone], F: [phone]).   Will await response.

## 2023-06-06 ENCOUNTER — Ambulatory Visit: Payer: 59 | Admitting: Clinical

## 2023-06-06 ENCOUNTER — Other Ambulatory Visit: Payer: Self-pay | Admitting: Gastroenterology

## 2023-06-06 DIAGNOSIS — F989 Unspecified behavioral and emotional disorders with onset usually occurring in childhood and adolescence: Secondary | ICD-10-CM | POA: Diagnosis not present

## 2023-06-06 DIAGNOSIS — F902 Attention-deficit hyperactivity disorder, combined type: Secondary | ICD-10-CM | POA: Diagnosis not present

## 2023-06-06 DIAGNOSIS — F411 Generalized anxiety disorder: Secondary | ICD-10-CM | POA: Diagnosis not present

## 2023-06-06 DIAGNOSIS — F4321 Adjustment disorder with depressed mood: Secondary | ICD-10-CM | POA: Diagnosis not present

## 2023-06-06 DIAGNOSIS — F3181 Bipolar II disorder: Secondary | ICD-10-CM | POA: Diagnosis not present
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
## 2023-06-10 NOTE — Telephone Encounter (Signed)
 F/u: Appeal is still pending.  Expected decision date is 06/18/23.  I will f/u once I we have their decision.

## 2023-06-13 DIAGNOSIS — F4321 Adjustment disorder with depressed mood: Secondary | ICD-10-CM | POA: Diagnosis not present

## 2023-06-14 ENCOUNTER — Ambulatory Visit: Admitting: Clinical

## 2023-06-14 DIAGNOSIS — F989 Unspecified behavioral and emotional disorders with onset usually occurring in childhood and adolescence: Secondary | ICD-10-CM

## 2023-06-14 NOTE — Telephone Encounter (Signed)
 [name], The denial has been over-turned.  The appeal has been approved.  Patient has been scheduled for 06/28/23.  Auth Submission: APPROVED - denial over-turned/approved Site of care: Site of care: [facility] Payer: [employer] Medication & CPT/J Code(s) submitted: Entyvio (Vedolizumab) [id number] Route of submission (phone, fax, portal): PORTAL Phone #[phone] Fax #[phone] Auth type: Buy/Bill PB Units/visits requested: 300MG  Q6WKS Reference number: [id number] Approval from: 05/15/23 to 05/14/24

## 2023-06-14 NOTE — Telephone Encounter (Signed)
 Great, thanks!
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
## 2023-06-18 DIAGNOSIS — R634 Abnormal weight loss: Secondary | ICD-10-CM | POA: Diagnosis not present

## 2023-06-18 DIAGNOSIS — E230 Hypopituitarism: Secondary | ICD-10-CM | POA: Diagnosis not present

## 2023-06-18 DIAGNOSIS — E291 Testicular hypofunction: Secondary | ICD-10-CM | POA: Diagnosis not present

## 2023-06-19 ENCOUNTER — Ambulatory Visit: Admitting: Clinical

## 2023-06-19 DIAGNOSIS — F989 Unspecified behavioral and emotional disorders with onset usually occurring in childhood and adolescence: Secondary | ICD-10-CM

## 2023-06-19 NOTE — Progress Notes (Signed)
 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 She was in her home and the examiner was in her office at time of appointment. Client is aware of risks of telehealth and consented to a virtual visit. Session consisted of completing a semi-structured diagnostic interview based on the ADI-R (1 hour, [id number], 2 units). Bradley [name] joined the last 15 minutes of the appointment. He is scheduled to complete the questions he missed on Monday.  Social Affect  Can you have a conversation with ____? What does he/she do if you say something to him/her, without directly asking a question?  Bradley [name]'s mother reported that he engages in reciprocal conversation. When presented with non-question statements, he tends to respond with comments that are off topic, instead focusing on his own train of thought. She reported that much of his conversation is focused on his own train of thought. She shared that, at times, this can create a feeling of being talked "at." She sometimes has to ask him to pause his train of thought and return to the question. Between the ages of 4-5, Bradley [name]'s mother reported that much of Bradley [name]'s conversation focused on interests in Star Wars and [address]. He was able to carry on a "very detailed conversation about things he really liked." In response to non-question statements, he sometimes responded with a relevant comment from his own train of thought, but rarely did these comments lend to a fully reciprocal conversation. Currently, Bradley [name]'s mother reported that her conversations with him are "stunted" overall. She noted that it catches her off guard when Bradley [name] asks her questions about her thoughts or experiences.   How is ____'s eye contact? How was his/her eye contact when he/she was between the ages of 4-5?  Bradley [name]'s mother described his eye contact as good overall, and consistent across familiar and unfamiliar others.  However, she noted that sometimes he holds eye contact "a little too long." When he was between the ages of 4-5, his eye contact was typical, and nothing unusual stood out.   Are there times when _____ uses socially inappropriate questions or statements?  Bradley [name]'s mother reported that he currently uses inappropriate questions or statements on occasion. She shared an example of Bradley [name] questioning an unfamiliar others medical condition in blunt fashion. She described him as blunt and, at times, inadvertently confrontational. This is to a degree that has, at times, caused tension in his relationships. She reported that he especially appears to point out inconsistencies in people's statements. When he was between the ages of 108-4, Bradley [name] often pointed out when people were doing things "wrong."   Bradley [name] knew how to play chess at age 34.   Has _______ ever gotten his personal pronouns the wrong way around? For example, mixing up "you" and "I" or "he" and "she?"  No pronoun confusion nwas reported currently or in the past.   Does ______ every spontaneously point to express interest? What about at 16-55 years old?  Bradley [name] was reported not to have pointed as a young child, and instead spoke to make requests or communicate. Currently, Bradley [name]'s mother noted that he does not ask for help, although he did so as a child. He does not point currently.   Does ______ ever nod or shake his/her head communicatively?  Bradley [name] nodded his head to mean, "yes," and shook his head to mean, "no" in early childhood. He no longer does so.    What is his/her use of gestures  like? What about at age 42-5?  Bradley [name] was reported to have used a range of gestures when he was between the ages of 4-5. His mother described him as "very physical" when he was between the ages of 4-5, and "had no problems entering people's personal space," which sometimes caused issues with his brother. Bradley [name]'s mother reported that he used many gestures between the  ages of 4-5, but uses fewer gestures at present. She noted that he is less animated presently.   From age 54-5, did ______ play any pretend games? What about with others? Does he/she do so currently?  When he was between the ages of 75-5, Bradley [name] enjoyed playing with action figures. She shared that she had reviewed videos of Bradley [name] as a child, and he often engaged in imaginative play, including with Special educational needs teacher and Elliot Gurney, and games involving them talking to each other, engaging in battles and full plotlines that varied regularly. He was especially interested in superheroes when he was between the ages of 4-5.    Does ______ every show you things that interest him/her? What about aged 4-5?  Bradley [name] was reported to have shown items of interest to others when he was between the ages of 4-5. This consisted of eye contact, vocalizations, and efforts to direct his mother's attention. He reportedly often said, "listen to me." Bradley [name] was very interested in Star Wars for much of his childhood. He rarely showed items not related to his specific interests. His mother noted that he talked "a lot" as a young child, to a degree that it was sometimes hard to understand his requests. Currently, he does not show items related to a range of interests to others. His mother noted that she has to ask him.   Does ____ have any particular friends or a best friend? What about between the ages of 4-5?  When he was between the ages of 4-5, Bradley [name] was reported to have gotten along with other children the family engaged with. However, his mother could not recall other children asking to play with Bradley [name]. He did approach unfamiliar, smiliarl aged children to play at this age.   Between the ages of 18-15, Bradley [name]'s mother reported that he had "maybe 1 friend." She noted that this friendship did not seem reciprocal, in that Bradley [name] "did not hang out with anyone." Most of the peers with whom he communicated were through the [employer].  "They did not come over and he didn't go anywhere." His mother noted that this did not appear to bother him at that age.   At time of intake, Bradley [name] was reported to have one friend, but does not have anyone he makes plans with. He does communicate with this friend via text. Bradley [name]'s mother shared a sense that he struggles to discern who might be good candidates for friendship and socialization.   Restricted and Repetitive Behaviors Has ______ ever tended to use rather odd phrases or same the same thing over and over again in almost exactly the same way? Either phrases he/she has heard others use or made up him/herself.   Bradley [name]'s mother reported that he sometimes misuses words but does not do so again once corrected. She could not recall any instances of stereotyped or repetitive speech.  Has ______ ever used words that seem to have been invented or made up? Does _____ ever put things in odd or indirect ways, or have idiosyncratic ways of saying things (e.g., "hot rain" for "steam").  No neologisms or idiosyncratic speech  was reported.    Does ______ every say the same things over and over in exactly the same way, or insist on you saying the same things over and over again?  Bradley [name]'s mother reported that, as a young child, if he was interrupted before completing a sentence he would have a "full on meltdown." This consisted of yelling, screaming, and crying, at times throwing himself to the floor. This was to a degree that the family attempted to avoid interrupting, but noted that it was difficult due to how frequently Bradley [name] spoke.    Is there anything unusual about the way ____ speaks? (Intonation, rate, rhythm, volume.)  Bradley [name]'s speech was described as typical overall since child.   Does _____ have any unusual interests that preoccupy him even when not physically present and may seem odd to others (e.g., toilets flushing, metal objects, street signs.)  No unusual preoccupations were reported  currently or in the past.   Does ___ have any special hobbies or interests that are unusual in their intensity?  Karey's mother reported that he had interests in Star Wars and superheroes for much of his childhood. She noted that these interests remained intense as he matured. He has also had an intense interest in baseball. She reported that he knows a wealth of baseball facts, including the players and their stats, to a degree that is beyond what might be considered typical. She reported that he often does not perceive when others are losing interest in his extensive talk about these interests. She shared that these interests have impacted his relationship with his brother and likely impacted his social relationships as a child.   Does ______ play with the whole toy or seem more interested in parts of the toy (for example, spinning the wheels repetitively on a toy car versus pretending to drive it)  Johanna has been very interested in [place] since he was between the ages of 7-8. He especially enjoys Artist. Roughly one year prior to intake, he took all of the Legos that he owns, and took weeks to sort them. This was to a degree that it was difficult to enter his room. His mother sat with him "for hours" to help him complete this task. She noted that, shortly prior to intake, she had stopped him from re-organizing the [place]. He also enjoys organizing and sorting the baseball cards. He has been especially interested in paraphernalia to help him organize his baseball cards. She shared that he probably spends 3-8 hours per day sorting items of interest. This is to a degree that he sometimes stays up very late in order to sort items. He did not do this as a child.  CHECK IN WITH Acie ABOUT CHILDHOOD  Does____ seem unusually sensitive to or interested in textures, smells, or noises?  Holmes's mother shared that he underwent through a period where he forbade others from eating soup or saying the word  "walk," because he was bothered by the sound. The family was not able to go to [employer] because he could hear the lighting. He also shared that he could smell the lights in [employer]. He shared that he is also sensitive to light, and sometimes has to wear sunglasses at night so as not to be bothered by the light from lampposts. He is bothered by the feeling of tags on clothes. He also noted that he has ruined clothes by ripping off tags. He shared that he does not like the sensation of touching pottery or any  slate-like material.   In terms of interests, Sharif shared that he "loves" the sound of children, as well as bubble wrap when he is popping it. He enjoys using therapy putty, and cleaning dryer lint from the trays. Yeudiel shared that, while in elementary school (between ages of 6-8), he enjoyed eating toilet and tissue paper. He shared that, several years prior, he had enjoyed eating paint chips off of the wall. This was to a degree that the walls had to be re-done. Notably, he had a severe iron deficiency. Conard noted that he no longer feels the urge to eat the paint chips, and this is largely motivated by avoiding the stress it caused his family. Baylin reported that he used to enjoy putting water bottle caps in his mouth, and chewing ice. He also especially enjoys rubbing his thumb on a concave shell. Lavaris also has a particular sponge that he enjoys holding.   How does _____ do with changes in routine?  Ikaika was reported generally not to struggle with more minor changes in routine, but his mother noted that he often asks many questions prior to anticipated changes. As a child, his mother noted that, if he had an idea in his mind of what was going to happen next, he was very upset. She noted that he often argued and became upset when the family made unexpected stops during their usual routine. Demonta's mother noted that this was to a degree that she continues to attempt to let him know in advance to changes  to plans and routines. As an adult, he may hide that he is upset, but may not come downstairs or eat for a day, or experiences GI distress when upset.   Has _____ every had any usual movements of hand or fingers, such as flicking fingers in front of his or her eyes or flapping hands when excited or upset?  Jermine shared he used to [employer] with his hands" as a child, and demonstrated a mannerism of posturing his hands against each other. He noted, however, that he adopted this from a friend who had autism, and did not do so prior to meeting this friend. He could not recall other hand mannerisms. His mother also noted having observed [place] to demonstrate hand mannerisms when he was upset or anxious.                Chrissie Noa, PhD

## 2023-06-20 DIAGNOSIS — F4321 Adjustment disorder with depressed mood: Secondary | ICD-10-CM | POA: Diagnosis not present

## 2023-06-24 ENCOUNTER — Ambulatory Visit: Admitting: Clinical

## 2023-06-24 DIAGNOSIS — F989 Unspecified behavioral and emotional disorders with onset usually occurring in childhood and adolescence: Secondary | ICD-10-CM | POA: Diagnosis not present

## 2023-06-24 NOTE — Progress Notes (Signed)
 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 He was in his home and examiner was in her office at time of appointment. Client is aware of risks of telehealth and consented to a virtual visit. He responded readily to all questions. He is scheduled to be seen in person for testing on 4/4.  Social Affect  Can you have a conversation with ____? What does he/she do if you say something to him/her, without directly asking a question?  Alanson's mother reported that he engages in reciprocal conversation. When presented with non-question statements, he tends to respond with comments that are off topic, instead focusing on his own train of thought. She reported that much of his conversation is focused on his own train of thought. She shared that, at times, this can create a feeling of being talked "at." She sometimes has to ask him to pause his train of thought and return to the question. Between the ages of 24-25, Brysen's mother reported that much of Tex's conversation focused on interests in Star Wars and [address]. He was able to carry on a "very detailed conversation about things he really liked." In response to non-question statements, he sometimes responded with a relevant comment from his own train of thought, but rarely did these comments lend to a fully reciprocal conversation. Currently, Bennie's mother reported that her conversations with him are "stunted" overall. She noted that it catches her off guard when Olanrewaju asks her questions about her thoughts or experiences.   Giovan reported that he loves to engage in conversation with others. However, he noted that he often needs to take long pauses to gather his thoughts. 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 He feels he  engages in reciprocal conversations with others, and responds to non-question statements with others. Between the ages of 24-25, he noted that he was able to talk on the phone. He shared that he talked so much that his aunt would become tired and bored on the phone.  How is ____'s eye contact? How was his/her eye contact when he/she was between the ages of 24-25?  Akhilesh's mother described his eye contact as good overall, and consistent across familiar and unfamiliar others. However, she noted that sometimes he holds eye contact "a little too long." When he was between the ages of 24-25, his eye contact was typical, and nothing unusual stood out.   At intake, Corrie shared that he does not make consistent eye contact with others. He noted that he makes an effort to make eye contact when listening, but not when talking.   Are there times when _____ uses socially inappropriate questions or statements?  Wise's mother reported that he currently uses inappropriate questions or statements on occasion. She shared an example of Maxine questioning an unfamiliar others medical condition in blunt fashion. She described him as blunt and, at times, inadvertently confrontational. This is to a degree that has, at times, caused tension in his relationships. She reported that he especially appears to point out inconsistencies in people's statements. When he was between the ages of 24-25, Rj often pointed out when people were doing things "wrong."   Almond shared that he sometimes makes socially inappropriate questions and statements. He shared an example of commenting to a friend who is celebrating  ramadan who can break fast when menstruating, and asking whether she is not fasting on a given day. He shared another example of commenting on how others' smell as a compliment. He shared that he also did this between the ages of 24-25.   Derold knew how to play chess at age 23.   Has _______ ever gotten his personal pronouns the wrong  way around? For example, mixing up "you" and "I" or "he" and "she?"  No pronoun confusion nwas reported currently or in the past. No pronoun confusion was reported currently or in the past.   Does ______ every spontaneously point to express interest? What about at 24-24 years old?  Nakia was reported not to have pointed as a young child, and instead spoke to make requests or communicate. Currently, Corydon's mother noted that he does not ask for help, although he did so as a child. He does not point currently.   Brittain reported that he used to point to share interest when he was between the ages of 24-25, but he was unsure whether he did so with eye contact. He noted that he does not point to share interest currently.   Does ______ ever nod or shake his/her head communicatively?  Cylan nodded his head to mean, "yes," and shook his head to mean, "no" in early childhood. He no longer does so. Enmanuel noted that he does not nod his head to mean, "yes" or shake it to mean, "no," but did so when he was between the ages of 24-25.    What is his/her use of gestures like? What about at age 24-24?  Stefanos was reported to have used a range of gestures when he was between the ages of 24-25. His mother described him as "very physical" when he was between the ages of 4-5, and "had no problems entering people's personal space," which sometimes caused issues with his brother. Magdaleno's mother reported that he used many gestures between the ages of 24-25, but uses fewer gestures at present. She noted that he is less animated presently.   Hitoshi shared that he uses a range of facial expressions communicatively. He noted that he uses emphatic gestures currently. He believes he used similar gestures when he was between the ages of 24-25.   From age 24-24, did ______ play any pretend games? What about with others? Does he/she do so currently?  When he was between the ages of 24-24, Janzen enjoyed playing with action figures. She shared  that she had reviewed videos of [employer] as a child, and he often engaged in imaginative play, including with Special educational needs teacher and Elliot Gurney, and games involving them talking to each other, engaging in battles and full plotlines that varied regularly. He was especially interested in superheroes when he was between the ages of 4-5.   Prentice shared that he engaged in pretend play involving figures as agents and a range of situations and plots.    Does ______ every show you things that interest him/her? What about aged 4-5?  Akeem was reported to have shown items of interest to others when he was between the ages of 4-5. This consisted of eye contact, vocalizations, and efforts to direct his mother's attention. He reportedly often said, "listen to me." Cyree was very interested in Star Wars for much of his childhood. He rarely showed items not related to his specific interests. His mother noted that he talked "a lot" as a young child, to a degree that it was sometimes  hard to understand his requests. Currently, he does not show items related to a range of interests to others. His mother noted that she has to ask him.   Jaksen shared that he showed objects to others when he was between the ages of 4-5. However, this tended to be focused on particular interests. He shows items to others currently, and this sometimes consists of pointing toward items. He also demonstrated opening and handing his sketch book to others while also making eye contact.   Does ____ have any particular friends or a best friend? What about between the ages of 4-5?  When he was between the ages of 4-5, Satoshi was reported to have gotten along with other children the family engaged with. However, his mother could not recall other children asking to play with Clydene Pugh. He did approach unfamiliar, smiliarl aged children to play at this age.   Between the ages of 36-15, Davide's mother reported that he had "maybe 1 friend." She noted that this  friendship did not seem reciprocal, in that Emily "did not hang out with anyone." Most of the peers with whom he communicated were through the [employer]. "They did not come over and he didn't go anywhere." His mother noted that this did not appear to bother him at that age.   At time of intake, Abie was reported to have one friend, but does not have anyone he makes plans with. He does communicate with this friend via text. Alta's mother shared a sense that he struggles to discern who might be good candidates for friendship and socialization.   He shared that he had a few friends between the ages of 37-5. He sometimes had playdates with these friends, and he reported that he requested to have playdates. He could not recall if the friends asked for playdates with him. Between the ages of 30-15, Roshun described his friendships as strong from 10-13, but reported that he had few friends by the time of his Recruitment consultant. He shared that he attempted to make friends but did not have any at that time. At time of intake, Jp reported having 2-3 friends. He reported that they get together to see shows, talk about sports, and draw. He speaks regularly with his friends. He especially communicates with one particular friend.  Restricted and Repetitive Behaviors Has ______ ever tended to use rather odd phrases or same the same thing over and over again in almost exactly the same way? Either phrases he/she has heard others use or made up him/herself.   Penn's mother reported that he sometimes misuses words but does not do so again once corrected. She could not recall any instances of stereotyped or repetitive speech.  Venancio reported that he has sometimes engaged in Engineer, drilling, Equities trader entire scenes from "[employer]," as well as star wars quotes and movie quotes. He uses these lines communicatively. He also especially enjoys a particular quote from the book "Thron," from the Star Wars series, and notes  that he tries to share it as much as possible.   Has ______ ever used words that seem to have been invented or made up? Does _____ ever put things in odd or indirect ways, or have idiosyncratic ways of saying things (e.g., "hot rain" for "steam").  No neologisms or idiosyncratic speech was reported. He shares that he often says "wouldn't have," and notes that he says this very often, and also shared that he says "unnoteworthy." He noted that he calls the TV  remote the "channel selector."    Does ______ every say the same things over and over in exactly the same way, or insist on you saying the same things over and over again?  Maxximus's mother reported that, as a young child, if he was interrupted before completing a sentence he would have a "full on meltdown." This consisted of yelling, screaming, and crying, at times throwing himself to the floor. This was to a degree that the family attempted to avoid interrupting, but noted that it was difficult due to how frequently Wade spoke.   Hanley shared that, when he tells a story, he needs to tell it in a certain way, from start to finish. He noted that this is to a degree that he becomes very irritated if interrupted, and has to restart, which his frustrating for him.     Is there anything unusual about the way ____ speaks? (Intonation, rate, rhythm, volume.)  Brookes's speech was described as typical overall since child. Wess shared that he often pauses while speaking to gather his thoughts. This has caused problems in a previous friendship, when the friend commented, "um" when he paused, and prompted him, "just say what's on your mind." He noted that he has felt self-conscious about the rate at which he speaks since then. He also noted that, when he was younger, he often used very elaborate analogies to convey even simple points, to the degree that he would lose track of what he was trying to communicate.  Does _____ have any unusual interests that  preoccupy him even when not physically present and may seem odd to others (e.g., toilets flushing, metal objects, street signs.)  No unusual preoccupations were reported currently or in the past. Gilmore shared that he sometimes becomes hyperfixated on cleaning the keyboard on his computer and as to clean it immediately if it looks dirty to him. He also noted that he has spent hours trying to fish out dryer lint that does not make it in to the tray. This is to a degree that he has partially broken the dryer from picking at it with pliers. He has also lost pliers in the dyer and had to fish them out with a magnetic pole. He has purchased an extension to the vacuum cleaner to fish it out. He noted that he also enjoys this activity. He has become very focused on pulling weeds, and when he notices weeds he stops what he is doing to pull them out. He also reported that he is especially interest in duct tape and cardboard boxes, and using them to build structures for his action figures.   Does ___ have any special hobbies or interests that are unusual in their intensity?  Hinton's mother reported that he had interests in Star Wars and superheroes for much of his childhood. She noted that these interests remained intense as he matured. He has also had an intense interest in baseball. She reported that he knows a wealth of baseball facts, including the players and their stats, to a degree that is beyond what might be considered typical. She reported that he often does not perceive when others are losing interest in his extensive talk about these interests. She shared that these interests have impacted his relationship with his brother and likely impacted his social relationships as a child.   Zaylon shared that he has been intensely interested in [employer]. He is also very interested in baseball. He noted that he had spent the past several  days organizing his baseball cards because he loves sorting. He noted that he  also loves playing with Lego sets.   Does ______ play with the whole toy or seem more interested in parts of the toy (for example, spinning the wheels repetitively on a toy car versus pretending to drive it)  Dante has been very interested in [employer] since he was between the ages of 7-8. He especially enjoys Artist. Roughly one year prior to intake, he took all of the Legos that he owns, and took weeks to sort them. This was to a degree that it was difficult to enter his room. His mother sat with him "for hours" to help him complete this task. She noted that, shortly prior to intake, she had stopped him from re-organizing the [place]. He also enjoys organizing and sorting the baseball cards. He has been especially interested in paraphernalia to help him organize his baseball cards. She shared that he probably spends 3-8 hours per day sorting items of interest. This is to a degree that he sometimes stays up very late in order to sort items. He did not do this as a child.   Elhadj reported that he especially enjoys sorting, and this has been a primary focus with baseball cards since childhood. He has also enjoyed sorting and organizing coins by year. This includes looking out for certain coins to put in collections. He became interested in sorting from about the age of 39-17.              Chrissie Noa, PhD

## 2023-06-27 DIAGNOSIS — F4321 Adjustment disorder with depressed mood: Secondary | ICD-10-CM | POA: Diagnosis not present

## 2023-06-28 ENCOUNTER — Ambulatory Visit: Payer: 59 | Admitting: *Deleted

## 2023-06-28 VITALS — BP 104/56 | HR 90 | Temp 97.7°F | Resp 16 | Ht 63.5 in | Wt 102.0 lb

## 2023-06-28 DIAGNOSIS — K50113 Crohn's disease of large intestine with fistula: Secondary | ICD-10-CM

## 2023-06-28 MED ORDER — VEDOLIZUMAB 300 MG IV SOLR
300.0000 mg | Freq: Once | INTRAVENOUS | Status: AC
  Administered 2023-06-28: 300 mg via INTRAVENOUS
  Filled 2023-06-28: qty 5

## 2023-06-28 NOTE — Progress Notes (Signed)
 Diagnosis: Crohn's Disease  Provider:  Chilton Greathouse MD  Procedure: IV Infusion  IV Type: Peripheral, IV Location: R Forearm  Entyvio (Vedolizumab), Dose: 300 mg  Infusion Start Time: 1104 am  Infusion Stop Time: 1147 am  Post Infusion IV Care: Observation period completed and Peripheral IV Discontinued  Discharge: Condition: Good, Destination: Home . AVS Declined  Performed by:  Forrest Moron, RN

## 2023-07-04 DIAGNOSIS — F4321 Adjustment disorder with depressed mood: Secondary | ICD-10-CM | POA: Diagnosis not present

## 2023-07-05 ENCOUNTER — Ambulatory Visit: Payer: 59 | Admitting: Clinical

## 2023-07-05 DIAGNOSIS — F989 Unspecified behavioral and emotional disorders with onset usually occurring in childhood and adolescence: Secondary | ICD-10-CM | POA: Diagnosis not present

## 2023-07-05 NOTE — Progress Notes (Signed)
 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 He completed the SB-5 (2 hours, 96136P 1 unit, 96137P 3 units), followed by the ADOS-2, Module 4 (1.5 hours, 96137P 3 units), followed by the CAT-Q (30 minutes, 96137P, 1 unit). The examiner then scored completed questionnaires (1 hour, 96137P 2 units), and began integrating results into a detailed evaluation report (1 hour report writing, 96130P 1 unit). Feedback from testing is scheduled for 5/9.  Examiner engaged in 1 hour report writing from 3:00pm-3:55pm on 07/05/2023.               Chrissie Noa, PhD

## 2023-07-11 DIAGNOSIS — F3181 Bipolar II disorder: Secondary | ICD-10-CM | POA: Diagnosis not present

## 2023-07-11 DIAGNOSIS — F902 Attention-deficit hyperactivity disorder, combined type: Secondary | ICD-10-CM | POA: Diagnosis not present

## 2023-07-11 DIAGNOSIS — F411 Generalized anxiety disorder: Secondary | ICD-10-CM | POA: Diagnosis not present

## 2023-07-11 DIAGNOSIS — F4321 Adjustment disorder with depressed mood: Secondary | ICD-10-CM | POA: Diagnosis not present

## 2023-07-11 DIAGNOSIS — G4700 Insomnia, unspecified: Secondary | ICD-10-CM | POA: Diagnosis not present

## 2023-07-18 DIAGNOSIS — F4321 Adjustment disorder with depressed mood: Secondary | ICD-10-CM | POA: Diagnosis not present

## 2023-07-21 ENCOUNTER — Encounter: Payer: Self-pay | Admitting: Gastroenterology

## 2023-07-21 DIAGNOSIS — K50118 Crohn's disease of large intestine with other complication: Secondary | ICD-10-CM

## 2023-07-22 ENCOUNTER — Encounter: Payer: Self-pay | Admitting: Gastroenterology

## 2023-07-22 ENCOUNTER — Other Ambulatory Visit (INDEPENDENT_AMBULATORY_CARE_PROVIDER_SITE_OTHER)

## 2023-07-22 DIAGNOSIS — K50118 Crohn's disease of large intestine with other complication: Secondary | ICD-10-CM | POA: Diagnosis not present

## 2023-07-22 LAB — COMPREHENSIVE METABOLIC PANEL WITH GFR
ALT: 41 U/L (ref 0–53)
AST: 26 U/L (ref 0–37)
Albumin: 4.5 g/dL (ref 3.5–5.2)
Alkaline Phosphatase: 131 U/L — ABNORMAL HIGH (ref 39–117)
BUN: 16 mg/dL (ref 6–23)
CO2: 32 meq/L (ref 19–32)
Calcium: 9.9 mg/dL (ref 8.4–10.5)
Chloride: 100 meq/L (ref 96–112)
Creatinine, Ser: 0.85 mg/dL (ref 0.40–1.50)
GFR: 122.37 mL/min (ref >60.00)
Glucose, Bld: 92 mg/dL (ref 70–99)
Potassium: 4.3 meq/L (ref 3.5–5.1)
Sodium: 139 meq/L (ref 135–145)
Total Bilirubin: 0.3 mg/dL (ref 0.2–1.2)
Total Protein: 8.1 g/dL (ref 6.0–8.3)

## 2023-07-22 LAB — IBC + FERRITIN
Ferritin: 53 ng/mL (ref 22.0–322.0)
Iron: 59 ug/dL (ref 42–165)
Saturation Ratios: 21.2 % (ref 20.0–50.0)
TIBC: 278.6 ug/dL (ref 250.0–450.0)
Transferrin: 199 mg/dL — ABNORMAL LOW (ref 212.0–360.0)

## 2023-07-22 LAB — CBC WITH DIFFERENTIAL/PLATELET
Basophils Absolute: 0 10*3/uL (ref 0.0–0.1)
Basophils Relative: 0.4 % (ref 0.0–3.0)
Eosinophils Absolute: 0.2 10*3/uL (ref 0.0–0.7)
Eosinophils Relative: 1.5 % (ref 0.0–5.0)
HCT: 41.9 % (ref 39.0–52.0)
Hemoglobin: 13.8 g/dL (ref 13.0–17.0)
Lymphocytes Relative: 17.9 % (ref 12.0–46.0)
Lymphs Abs: 1.9 10*3/uL (ref 0.7–4.0)
MCHC: 33 g/dL (ref 30.0–36.0)
MCV: 91.1 fl (ref 78.0–100.0)
Monocytes Absolute: 0.6 10*3/uL (ref 0.1–1.0)
Monocytes Relative: 5.3 % (ref 3.0–12.0)
Neutro Abs: 8.1 10*3/uL — ABNORMAL HIGH (ref 1.4–7.7)
Neutrophils Relative %: 74.9 % (ref 43.0–77.0)
Platelets: 375 10*3/uL (ref 150.0–400.0)
RBC: 4.6 Mil/uL (ref 4.22–5.81)
RDW: 12.4 % (ref 11.5–15.5)
WBC: 10.8 10*3/uL — ABNORMAL HIGH (ref 4.0–10.5)

## 2023-07-25 DIAGNOSIS — F4321 Adjustment disorder with depressed mood: Secondary | ICD-10-CM | POA: Diagnosis not present

## 2023-07-29 ENCOUNTER — Other Ambulatory Visit

## 2023-07-29 ENCOUNTER — Other Ambulatory Visit: Payer: Self-pay | Admitting: *Deleted

## 2023-07-29 DIAGNOSIS — K50118 Crohn's disease of large intestine with other complication: Secondary | ICD-10-CM

## 2023-07-30 ENCOUNTER — Ambulatory Visit (INDEPENDENT_AMBULATORY_CARE_PROVIDER_SITE_OTHER): Admitting: Gastroenterology

## 2023-07-30 ENCOUNTER — Encounter: Payer: Self-pay | Admitting: Gastroenterology

## 2023-07-30 VITALS — BP 98/62 | HR 105 | Ht 63.5 in | Wt 100.0 lb

## 2023-07-30 DIAGNOSIS — K649 Unspecified hemorrhoids: Secondary | ICD-10-CM | POA: Diagnosis not present

## 2023-07-30 DIAGNOSIS — K5909 Other constipation: Secondary | ICD-10-CM | POA: Diagnosis not present

## 2023-07-30 DIAGNOSIS — R636 Underweight: Secondary | ICD-10-CM

## 2023-07-30 DIAGNOSIS — K50118 Crohn's disease of large intestine with other complication: Secondary | ICD-10-CM

## 2023-07-30 DIAGNOSIS — K603 Anal fistula, unspecified: Secondary | ICD-10-CM | POA: Diagnosis not present

## 2023-07-30 DIAGNOSIS — K602 Anal fissure, unspecified: Secondary | ICD-10-CM

## 2023-07-30 NOTE — Progress Notes (Signed)
 HPI :  23 year old male here for follow-up visit for Crohn's disease, accompanied by his parents today.   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   Previously followed by peds GI, Dr. Bretta Camp.  Carries a diagnosis of colitis since 2017.  On Humira  in the past but it did not provide any benefit, sounds like he was a primary nonresponder.  He has been on courses of steroids over the years.  Has a history of C. difficile.  Was on Xeljanz  for what sounds like a year or so in 2023, but it did not control him and he had flare despite that. 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 Camilo Cella of colorectal surgery  He has been on Entyvio  since December 2023.   SINCE LAST VISIT   Patient here for visit for his Crohn's disease, constipation, rectal discomfort.  Recall he has been on Entyvio  since December 2023 and tolerating it well.  He feels this is the best regimen he has been on since he is having Crohn disease although he still has symptoms that can bother him.  He had a colonoscopy with me in September which showed no active colonic inflammation.  He did have large inflamed internal hemorrhoids on that exam.  Recommended a course of Anusol  and Calmol 4 as needed.  Generally he has been doing okay since have seen him.  He has concerns about remaining chronically underweight.  He has a difficult time gaining weight, has weight and at times just less than 100 pounds.  He is 100 pounds today.  In looking at his file he was 105 pounds about a year ago.  He states he had a good appetite, no nausea or vomiting, however he only eats about 2 meals per day, generally does not eat lunch.  States he is just busy or does not have the motivation to find a time to eat 3 meals per day.  He has used some Ensure but has not tried high caloric weight gain or  etc.  He does inquire about seeing a nutritionist who specializes in Crohn's disease and lactose intolerance.  He has been moving his bowels okay.  He has about 3 bowel movements per week, states he has good output when he does go to the bathroom.  He does have some rectal discomfort following his bowel movements.  This has been attributed to hemorrhoids and anal fissure in the past.  He states sometimes when it is bad it can take about a day recovering from the pain to go away.  He had to miss work yesterday due to rectal discomfort.  He has been using some stool softeners occasionally.  He does not like taking MiraLAX  so does not take it.  He feels that Linzess  even at its lowest dose has been far too strong for him.  He is not taking any fiber supplementation at this time.  He cannot recall when he last tried that.  He has not had any perianal abscess or drainage in regards to fistula.  Recall he seen colorectal surgery in the past and given he did not have any obvious fistulous opening or active disease they did not recommend any intervention.  Recall at 1 point we did try to get him Entyvio  at a higher dosing given he has persistent mildly elevated fecal calprotectin, but his  insurance company declined it.  He just submitted a fecal calprotectin yesterday, that remains pending.  Hemoglobin is normal, no anemia.    Prior workup: Colonoscopy 09/30/20 - active left sided colitis on biopsies- moderate   Colonoscopy 01/18/2022 - Dr. Bretta Camp - active inflammation from anal margin to 20cm from anal verge - May score 1, improved from prior exam reportedly. Normal ileum, cecal patch noted. Poor prep noted.   Path shows normal right / transverse colon, mildly active left sided disease     MRI pelvis 03/14/22: IMPRESSION: Simple intersphincteric perianal fistula originating at the 6 o'clock position of the inferior anal sphincter, with extension in the subcutaneous tissues along the left side of the  gluteal crease. No evidence of abscess.   Fecal calprotectin 3660 - 03/16/22   1st Enyvio  infusion 03/21/22   Fecal calprotectin 07/13/22 - 228   Increased Entyvio  to every 6 week dosing   Fecal calprotectin 11/26/22 - 240   Colonoscopy 12/31/22: - Hemorrhoids were found on perianal exam. Large / inflamed and prolapse but able to be reduced (grade III). No obvious fistulas noted. Some superficial ulceration overlying hemorrhoids noted. Findings: - The terminal ileum appeared normal. - A large amount of liquid stool was found in the entire colon, making visualization difficult. Lavage of the colon was performed using copious amounts, resulting in clearance with mostly adequate visualization. Some areas of the dependant portion of the colon were hard to clear entirely and some obscured views in those areas. Several minutes spent lavaging the colon to achieve adequate views. - Internal hemorrhoids were found during retroflexion. Scarring of the distal rectum noted on retroflexed views but no obvious active inflammation. - The exam was otherwise without abnormality. No active inflammation.  FINAL DIAGNOSIS       1. Surgical [P], right colon, transverse :      -  COLONIC MUCOSA WITH A PROMINENT LYMPHOID AGGREGATE, OTHERWISE NO SIGNIFICANT      PATHOLOGY.       2. Surgical [P], left colon :      -  COLONIC MUCOSA WITH MINIMAL ARCHITECTURAL DISARRAY AND A PROMINENT LYMPHOID      AGGREGATE.      NOTE: THE CLINICAL HISTORY OF CROHN'S DISEASE IS NOTED.  PART 2 SHOWS MINIMAL      ARCHITECTURAL DISARRAY AND PART 1 DOES SHOW OCCASIONAL CRYPT DROPOUT; HOWEVER,      THERE IS OTHERWISE NO SIGNIFICANT PATHOLOGIC ABNORMALITY INCLUDING ACTIVITY,      GRANULOMAS, VIRAL CYTOPATHIC EFFECT OR DYSPLASIA.  THE OVERALL FINDINGS COULD      REPRESENT A CHRONIC INACTIVE/QUIESCENT COLITIS.  DEFINITIVE EVIDENCE OF CROHN'S      DISEASE IS NOT IDENTIFIED.   Past Medical History:  Diagnosis Date   ADHD (attention  deficit hyperactivity disorder)    Anal fissure    Anemia    Anxiety    Chronic constipation    Crohn's disease (HCC)    Depression    Encopresis(307.7)    Growth hormone deficiency (HCC)    GSE (gluten-sensitive enteropathy)    Hemorrhoids    Inflammatory bowel disease    Lactose intolerance    Mood disorder (HCC)    Perianal fistula    Psoriasis    Scoliosis    Short stature    Solitary rectal ulcer syndrome      Past Surgical History:  Procedure Laterality Date   ADENOIDECTOMY     COLONOSCOPY N/A 12/27/2015   Procedure: COLONOSCOPY;  Surgeon: Calvert Caul, MD;  Location: [facility] ENDOSCOPY;  Service: Gastroenterology;  Laterality: N/A;   ESOPHAGOGASTRODUODENOSCOPY N/A 12/27/2015   Procedure: ESOPHAGOGASTRODUODENOSCOPY (EGD);  Surgeon: Calvert Caul, MD;  Location: [facility] ENDOSCOPY;  Service: Gastroenterology;  Laterality: N/A;   ESOPHAGOGASTRODUODENOSCOPY N/A 08/03/2016   Procedure: ESOPHAGOGASTRODUODENOSCOPY (EGD);  Surgeon: Calvert Caul, MD;  Location: [facility] ENDOSCOPY;  Service: Gastroenterology;  Laterality: N/A;   FLEXIBLE SIGMOIDOSCOPY  08/03/2016   Procedure: FLEXIBLE SIGMOIDOSCOPY;  Surgeon: Calvert Caul, MD;  Location: [facility] ENDOSCOPY;  Service: Gastroenterology;;   RECTOPEXY  01/19/2021   laproscopy, rectopexy for Prolapse, sigmoidoscopy, flex  ( Dr. Nadeen [name])   tubes and adenoids     Family History  Problem Relation Age of Onset   Depression Mother    Miscarriages / [place] Mother    Depression Maternal Aunt    Hyperlipidemia Maternal Grandmother    Depression Maternal Grandmother    Cancer Maternal Grandfather        prostate   Hyperlipidemia Maternal Grandfather    Hypertension Maternal Grandfather    Ulcerative colitis Maternal Grandfather    Hyperlipidemia Paternal Grandmother    Thyroid  disease Paternal Grandmother    Cancer Paternal Grandmother    Diabetes Paternal Grandfather    Hyperlipidemia Paternal Grandfather    Cancer Paternal  Grandfather    Thyroid  disease Paternal Grandfather    Celiac disease Neg Hx    Hirschsprung's disease Neg Hx    Colon cancer Neg Hx    Esophageal cancer Neg Hx    Pancreatic cancer Neg Hx    Stomach cancer Neg Hx    Social History   Tobacco Use   Smoking status: Never   Smokeless tobacco: Never  Vaping Use   Vaping status: Never Used  Substance Use Topics   Alcohol use: No   Drug use: No   Current Outpatient Medications  Medication Sig Dispense Refill   AMBULATORY NON FORMULARY MEDICATION Diltiazem gel with 5% lidocaine   Apply a pea sized amount into your rectum three times daily as needed 30 g 0   Cholecalciferol (VITAMIN D -3) 125 MCG (5000 UT) TABS Take 5,000 Units by mouth daily. 30 tablet    ENTYVIO  300 MG injection RECONSTITUTE WITH 4.8 ML STERILE WATER. ADD 5 ML TO 250 ML NS AND INFUSE 300 MG INTRAVENOUSLY OVER 30 MINUTES EVERY 6 WEEKS 1 each 6   escitalopram  (LEXAPRO ) 20 MG tablet Take 30 mg by mouth daily.     escitalopram  (LEXAPRO ) 20 MG tablet 2.5 tablets Orally Once a day for 30 days     ferrous sulfate 325 (65 FE) MG tablet Take 325 mg by mouth daily with breakfast.     hydrocortisone  (ANUSOL -HC) 25 MG suppository Place 1 suppository (25 mg total) rectally daily. 10 suppository 1   Iron , Ferrous Sulfate, 325 (65 Fe) MG TABS 1 tablet Orally once daily     lamoTRIgine  (LAMICTAL ) 150 MG tablet Take 1 tablet (150 mg total) by mouth 2 (two) times daily. 60 tablet 1   linaclotide  (LINZESS ) 72 MCG capsule Take 1 capsule (72 mcg total) by mouth daily before breakfast. 30 capsule 2   lithium  carbonate (LITHOBID) 300 MG ER tablet Take 300 mg by mouth at bedtime.     polyethylene glycol (MIRALAX ) 17 g packet Take 17 g by mouth daily. Titrate as needed 14 each 0   QUEtiapine (SEROQUEL) 25 MG tablet Take by mouth.     Rectal Protectant-Emollient (CALMOL-4) 76-10 % SUPP Use as directed as needed     traZODone  (DESYREL ) 50 MG  tablet Take 75 mg by mouth at bedtime.     VYVANSE 70  MG capsule Take 70 mg by mouth every morning.     No current facility-administered medications for this visit.   Allergies  Allergen Reactions   Trileptal [Oxcarbazepine] Rash     Review of Systems: All systems reviewed and negative except where noted in HPI.   Lab Results  Component Value Date   WBC 10.8 (H) 07/22/2023   HGB 13.8 07/22/2023   HCT 41.9 07/22/2023   MCV 91.1 07/22/2023   PLT 375.0 07/22/2023    Lab Results  Component Value Date   NA 139 07/22/2023   CL 100 07/22/2023   K 4.3 07/22/2023   CO2 32 07/22/2023   BUN 16 07/22/2023   CREATININE 0.85 07/22/2023   GFR 122.37 07/22/2023   CALCIUM  9.9 07/22/2023   PHOS 5.1 (H) 08/09/2016   ALBUMIN 4.5 07/22/2023   GLUCOSE 92 07/22/2023    Lab Results  Component Value Date   ALT 41 07/22/2023   AST 26 07/22/2023   ALKPHOS 131 (H) 07/22/2023   BILITOT 0.3 07/22/2023     Physical Exam: BP 98/62   Pulse (!) 105   Ht 5' 3.5" (1.613 m)   Wt 100 lb (45.4 kg)   BMI 17.44 kg/m  Constitutional: Pleasant, male in no acute distress. Neurological: Alert and oriented to person place and time. Psychiatric: Normal mood and affect. Behavior is normal.   ASSESSMENT: 23 y.o. male here for assessment of the following  1. Crohn's disease of large intestine with other complication (HCC)   2. Underweight   3. Perianal fistula   4. Hemorrhoids, unspecified hemorrhoid type   5. Anal fissure   6. Other constipation    Reviewed his history with him.  Entyvio  clearly has helped him, his last colonoscopy showed no active colitis.  He does have inflamed internal hemorrhoids and a history of anal fissures which I think are causing his rectal pain after bowel movements.  He still has constipation which I think is driving a lot of this.  Does not like taking MiraLAX , Linzess  is too strong for him, only using stool softeners as needed.  We discussed using a fiber supplement daily to help keep stool soft and prevent straining.   He is amenable to try this, he wishes to try gummy fiber supplements first.  If that is not strong enough may try some Citrucel if he is willing.  Would continue Calmol 4 suppositories as needed for pain which helps him.  He did not have any active fistula at the time of colonoscopy, no drainage, he has not required seton etc.  He recently submitted a fecal calprotectin normal trend without his going.  Recall he had a mildly elevated fecal calprotectin in recent years but his insurance company has denied more frequent dosing of Entyvio .  That being said I think he is generally doing well in regards to his Crohn's, I think his symptoms again are rectal pain in the setting of constipation and history of fissures which is poorly controlled  Otherwise one of his main concerns is that he remains underweight and has had a hard time gaining weight..  I reviewed his nutritional intake, he is only eating 2 meals per day, he needs more caloric intake in order to gain weight and we discussed this.  He will try to do his best to eat 3 meals a day or try and get weight gainer or/protein supplement as well.  I think you would benefit from seeing a nutritionist.  He wants to see a nutritionist who specializes in Crohn's disease, I gave him the name of a practice in [place] who does this and has had good reviews.  I am not aware of anyone locally who specifically deals with Crohn's disease patients.  In addition, he has never had a prior endoscopy through his course.  Upper tract Crohn's remains possible and given his difficulty maintaining weight, I think we should screen his upper tract to rule out upper tract Crohn's.  I discussed risks and benefits of endoscopy and anesthesia with him and he wants to proceed.  Further recommendations pending those results.  Otherwise we will give him a note for missed school regarding yesterday absence and today for his appointment.  PLAN: - continue Entyvio  for now - await pending  fecal calprotectin - schedule EGD at the [facility] - rule out upper tract Crohn's - referral to see nutritionist in [place], specializes in Crohn's disease - increase to three meals per day and add weight gainer / protein supplement - continue Calmol4 supp PRN, samples given - add daily fiber supplement - wants to use fiber gummies PRN - needs note to excuse from school for 2 days  - f/u 6 months or sooner with issues  Christi Coward, MD [facility]

## 2023-07-30 NOTE — Patient Instructions (Addendum)
 You have been scheduled for an endoscopy. Please follow written instructions given to you at your visit today.  If you use inhalers (even only as needed), please bring them with you on the day of your procedure.  If you take any of the following medications, they will need to be adjusted prior to your procedure:   DO NOT TAKE 7 DAYS PRIOR TO TEST- Trulicity (dulaglutide) Ozempic, Wegovy (semaglutide) Mounjaro (tirzepatide) Bydureon Bcise (exanatide extended release)  DO NOT TAKE 1 DAY PRIOR TO YOUR TEST Rybelsus (semaglutide) Adlyxin (lixisenatide) Victoza (liraglutide) Byetta (exanatide) __________________________________________________________________________  Start a daily fiber supplement.  Eat three meals a day.  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  [employer] [place], [place] [address] [phone] Genevieve Kerry  Phone: [phone]  We are giving you some samples today of Calmol 4 suppositories to use as directed as needed.  Please follow up in 6 months.  Thank you for entrusting me with your care and for choosing [facility], Dr. Alvester Johnson  If your blood pressure at your visit was 140/90 or greater, please contact your primary care physician to follow up on this. ______________________________________________________  If you are age 17 or older, your body mass index should be between 23-30. Your Body mass index is 17.44 kg/m. If this is out of the aforementioned range listed, please consider follow up with your Primary Care Provider.  If you are age 56 or younger, your body mass index should be between 19-25. Your Body mass index is 17.44 kg/m. If this is out of the aformentioned range listed, please consider follow up with your Primary Care Provider.  ________________________________________________________  The [facility] providers would like to encourage you to use MYCHART to communicate with providers for non-urgent requests or questions.   Due to long hold times on the telephone, sending your provider a message by [facility] may be a faster and more efficient way to get a response.  Please allow 48 business hours for a response.  Please remember that this is for non-urgent requests.  _______________________________________________________  Due to recent changes in healthcare laws, you may see the results of your imaging and laboratory studies on MyChart before your provider has had a chance to review them.  We understand that in some cases there may be results that are confusing or concerning to you. Not all laboratory results come back in the same time frame and the provider may be waiting for multiple results in order to interpret others.  Please give us  48 hours in order for your provider to thoroughly review all the results before contacting the office for clarification of your results.

## 2023-07-31 ENCOUNTER — Encounter: Payer: Self-pay | Admitting: Gastroenterology

## 2023-07-31 LAB — CALPROTECTIN, FECAL: Calprotectin, Fecal: 16 ug/g (ref 0–120)

## 2023-08-01 ENCOUNTER — Encounter: Payer: Self-pay | Admitting: Gastroenterology

## 2023-08-01 ENCOUNTER — Ambulatory Visit (AMBULATORY_SURGERY_CENTER): Admitting: Gastroenterology

## 2023-08-01 VITALS — BP 104/69 | HR 86 | Temp 97.7°F | Resp 10 | Ht 63.5 in | Wt 100.0 lb

## 2023-08-01 DIAGNOSIS — R636 Underweight: Secondary | ICD-10-CM | POA: Diagnosis not present

## 2023-08-01 DIAGNOSIS — K50118 Crohn's disease of large intestine with other complication: Secondary | ICD-10-CM | POA: Diagnosis not present

## 2023-08-01 DIAGNOSIS — F4321 Adjustment disorder with depressed mood: Secondary | ICD-10-CM | POA: Diagnosis not present

## 2023-08-01 MED ORDER — SODIUM CHLORIDE 0.9 % IV SOLN: 500.0000 mL | Freq: Once | INTRAVENOUS | Status: DC

## 2023-08-01 NOTE — Progress Notes (Signed)
 Called to room to assist during endoscopic procedure.  Patient ID and intended procedure confirmed with present staff. Received instructions for my participation in the procedure from the performing physician.

## 2023-08-01 NOTE — Progress Notes (Signed)
 History and Physical Interval Note: Seen 07/30/23 - see note for details, no interval changes. Chronically underweight, history of Crohn's disease on Entyvio . Fecal calprotectin is normal. EGD to further evaluate - rule out upper tract involvement of Crohn's disease.     08/01/2023 2:25 PM  Bradley [name]  has presented today for endoscopic procedure(s), with the diagnosis of  Encounter Diagnoses  Name Primary?   Underweight Yes   Crohn's disease of large intestine with other complication (HCC)   .  The various methods of evaluation and treatment have been discussed with the patient and/or family. After consideration of risks, benefits and other options for treatment, the patient has consented to  the endoscopic procedure(s).   The patient's history has been reviewed, patient examined, no change in status, stable for surgery.  I have reviewed the patient's chart and labs.  Questions were answered to the patient's satisfaction.    Christi Coward, MD [facility]

## 2023-08-01 NOTE — Progress Notes (Signed)
 Vss nad trans to pacu

## 2023-08-01 NOTE — Op Note (Signed)
 Bradley [name] Patient Name: Bradley [name] Procedure Date: 08/01/2023 2:24 PM MRN: [id number] Endoscopist: Bradley [name] , MD, [id number] Age: 23 Referring MD:  Date of Birth: 10/26/2000 Gender: Male Account #: [id number] Procedure:                Upper GI endoscopy Indications:              Crohn's disease - chornic underweight / inability                            to gain weight, EGD to rule out upper tract                            involvement Medicines:                Monitored Anesthesia Care Procedure:                Pre-Anesthesia Assessment:                           - Prior to the procedure, a History and Physical                            was performed, and patient medications and                            allergies were reviewed. The patient's tolerance of                            previous anesthesia was also reviewed. The risks                            and benefits of the procedure and the sedation                            options and risks were discussed with the patient.                            All questions were answered, and informed consent                            was obtained. Prior Anticoagulants: The patient has                            taken no anticoagulant or antiplatelet agents. ASA                            Grade Assessment: II - A patient with mild systemic                            disease. After reviewing the risks and benefits,                            the patient was deemed in satisfactory condition to  undergo the procedure.                           After obtaining informed consent, the endoscope was                            passed under direct vision. Throughout the                            procedure, the patient's blood pressure, pulse, and                            oxygen saturations were monitored continuously. The                            Olympus Scope [id number] was introduced  through the                            mouth, and advanced to the second part of duodenum.                            The upper GI endoscopy was accomplished without                            difficulty. The patient tolerated the procedure                            well. Scope In: Scope Out: Findings:                 Esophagogastric landmarks were identified: the                            Z-line was found at 39 cm, the gastroesophageal                            junction was found at 39 cm and the upper extent of                            the gastric folds was found at 39 cm from the                            incisors.                           The exam of the esophagus was otherwise normal.                           The entire examined stomach was normal. Biopsies                            were taken with a cold forceps for Helicobacter                            pylori testing.  The examined duodenum was normal. Biopsies were                            taken with a cold forceps for histology. Complications:            No immediate complications. Estimated blood loss:                            Minimal. Estimated Blood Loss:     Estimated blood loss was minimal. Impression:               - Esophagogastric landmarks identified.                           - Normal esophagus otherwise.                           - Normal stomach. Biopsied.                           - Normal examined duodenum. Biopsied.                           No overt evidence of upper tract Crohn's disease,                            will await biopsy results. Recommendation:           - Patient has a contact number available for                            emergencies. The signs and symptoms of potential                            delayed complications were discussed with the                            patient. Return to normal activities tomorrow.                            Written  discharge instructions were provided to the                            patient.                           - Resume previous diet.                           - Continue present medications.                           - Await pathology results.                           - Nutrition consult as recommended per office visit                           -  Make sure you are eating 3 meals per day and                            supplement calories with protein shakes / weight                            gainer, etc. Bradley [name]. Bradley Brailsford, MD 08/01/2023 2:48:00 PM This report has been signed electronically.

## 2023-08-01 NOTE — Patient Instructions (Addendum)
-   Resume previous diet. - Continue present medications. - Await pathology results. - Nutrition consult as recommended per office visit - Make sure you are eating 3 meals per day and supplement calories with protein shakes / weight gainer, etc.  YOU HAD AN ENDOSCOPIC PROCEDURE TODAY AT THE [facility]:   Refer to the procedure report that was given to you for any specific questions about what was found during the examination.  If the procedure report does not answer your questions, please call your gastroenterologist to clarify.  If you requested that your care partner not be given the details of your procedure findings, then the procedure report has been included in a sealed envelope for you to review at your convenience later.  YOU SHOULD EXPECT: Some feelings of bloating in the abdomen. Passage of more gas than usual.  Walking can help get rid of the air that was put into your GI tract during the procedure and reduce the bloating. If you had a lower endoscopy (such as a colonoscopy or flexible sigmoidoscopy) you may notice spotting of blood in your stool or on the toilet paper. If you underwent a bowel prep for your procedure, you may not have a normal bowel movement for a few days.  Please Note:  You might notice some irritation and congestion in your nose or some drainage.  This is from the oxygen used during your procedure.  There is no need for concern and it should clear up in a day or so.  SYMPTOMS TO REPORT IMMEDIATELY:  Following upper endoscopy (EGD)  Vomiting of blood or coffee ground material  New chest pain or pain under the shoulder blades  Painful or persistently difficult swallowing  New shortness of breath  Fever of 100F or higher  Black, tarry-looking stools  For urgent or emergent issues, a gastroenterologist can be reached at any hour by calling [phone]. Do not use MyChart messaging for urgent concerns.    DIET:  We do recommend a small meal at  first, but then you may proceed to your regular diet.  Drink plenty of fluids but you should avoid alcoholic beverages for 24 hours.  ACTIVITY:  You should plan to take it easy for the rest of today and you should NOT DRIVE or use heavy machinery until tomorrow (because of the sedation medicines used during the test).    FOLLOW UP: Our staff will call the number listed on your records the next business day following your procedure.  We will call around 7:15- 8:00 am to check on you and address any questions or concerns that you may have regarding the information given to you following your procedure. If we do not reach you, we will leave a message.     If any biopsies were taken you will be contacted by phone or by letter within the next 1-3 weeks.  Please call us  at [phone] if you have not heard about the biopsies in 3 weeks.    SIGNATURES/CONFIDENTIALITY: You and/or your care partner have signed paperwork which will be entered into your electronic medical record.  These signatures attest to the fact that that the information above on your After Visit Summary has been reviewed and is understood.  Full responsibility of the confidentiality of this discharge information lies with you and/or your care-partner.

## 2023-08-02 ENCOUNTER — Telehealth: Payer: Self-pay

## 2023-08-02 NOTE — Telephone Encounter (Signed)
 Left message on answering machine.

## 2023-08-06 LAB — SURGICAL PATHOLOGY

## 2023-08-08 ENCOUNTER — Encounter: Payer: Self-pay | Admitting: Gastroenterology

## 2023-08-09 ENCOUNTER — Ambulatory Visit (INDEPENDENT_AMBULATORY_CARE_PROVIDER_SITE_OTHER)

## 2023-08-09 ENCOUNTER — Ambulatory Visit (INDEPENDENT_AMBULATORY_CARE_PROVIDER_SITE_OTHER): Admitting: Clinical

## 2023-08-09 VITALS — BP 119/76 | HR 94 | Temp 98.7°F | Resp 16 | Ht 63.5 in | Wt 99.4 lb

## 2023-08-09 DIAGNOSIS — F840 Autistic disorder: Secondary | ICD-10-CM | POA: Diagnosis not present

## 2023-08-09 DIAGNOSIS — K50113 Crohn's disease of large intestine with fistula: Secondary | ICD-10-CM

## 2023-08-09 MED ORDER — VEDOLIZUMAB 300 MG IV SOLR
300.0000 mg | Freq: Once | INTRAVENOUS | Status: AC
  Administered 2023-08-09: 300 mg via INTRAVENOUS
  Filled 2023-08-09: qty 5

## 2023-08-09 NOTE — Progress Notes (Signed)
 Diagnosis: Crohn's Disease  Provider:  Praveen Mannam MD  Procedure: IV Infusion  IV Type: Peripheral, IV Location: L Antecubital  Entyvio  (Vedolizumab ), Dose: 300 mg  Infusion Start Time: 1106  Infusion Stop Time: 1140  Post Infusion IV Care: Peripheral IV Discontinued  Discharge: Condition: Good, Destination: Home . AVS Declined  Performed by:  Navdeep Fessenden, RN

## 2023-08-09 NOTE — Progress Notes (Signed)
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 he is also sensitive to light, and sometimes has to wear sunglasses at night so as not to be bothered by the light from lampposts. He is bothered by the feeling of tags on clothes. He also noted that he has ruined clothes by ripping off tags. He shared that he does not like the sensation of touching pottery or any slate-like material. In terms of sensory interests, Aneil shared that he "loves" the sound of children, as well as bubble wrap when he is popping it. He enjoys using therapy putty, and cleaning dryer lint from the trays. Jlen shared that, while in elementary school (between ages of 7-8), he enjoyed eating toilet and tissue paper. He shared that, several years prior, he had enjoyed eating paint chips off of the wall. This was to a degree that the walls had to be re-done. Notably, he had a severe iron  deficiency at the time. Mycal noted that he no longer feels the urge to eat the paint chips, and this is largely motivated by avoiding  the stress it caused his family. Samwise reported that he used to enjoy putting water bottle caps in his mouth, and chewing ice. He also especially enjoys rubbing his thumb on a concave shell. Almanzo also has a particular sponge that he enjoys holding. Hutchinson was reported generally not to struggle with more minor changes in routine, but his mother noted that he often asks many questions prior to anticipated changes. As a child, he became very upset by changing in plans. Javius's  mother noted noted that he often argued and became upset when the family made unexpected stops during their usual routine. This is to a degree that she continues to attempt to let him know in advance to changes to plans and routines. As an adult, he may hide that he is upset, but may not come downstairs or eat for a day, and/or experiences GI distress when upset. In terms of hand and body mannerisms, Ahadu shared he used to [employer] with his hands" as a child, and demonstrated a mannerism of posturing his hands against each other. He noted, however, that he adopted this from a friend who had autism, and did not do so prior to meeting this friend. He could not recall other hand mannerisms. His mother also noted having observed Johntavious to demonstrate hand mannerisms when he was upset or anxious.     Review of Records Zacari's mother provided records from a psychological evaluation he underwent in June and July of 2011, when he was 23 years old, with Dr. Terese Fendt Dew. A brief summary of results from this evaluation is provided below. For a full overview of all findings, including interpretations of scores, please consult the original evaluation report.   Reasons for referral for testing are listed as "some obsessive hand washing in the past," as well as "difficulties with underachievement at school, talking at the wrong times, feeling disliked by his peers or teachers, telling others what to do and playing 'kid cop,'" and "failing to communicate effectively when upset about something." Behavioral observations include, "during the course of the evaluation, he was clearly not thrilled to be doing it," and that Shermon was "clearly very tired and exhausted." As a result, it is stated that "this evaluation may well be an underestimate of Ramesh's overall abilities and academic achievement were he to be motivated and paying attention, but probably represents fairly well how he does on a daily basis when he is not  motivated and not paying particular good attention."   Wechsler Intelligence Scales for Children, Fourth Edition (WISC-IV, Summer 2011) Scale Composite Score/ Scaled Score Percentile Rank 95% Confidence Interval Qualitative Description  Verbal Comprehension  98 45 91-105 Average  Similarities 14 91    Vocabulary 4 2    Comprehension 11 63    Perceptual Reasoning  112 79 103-119 High Average  Block Design 11 63    Picture Concepts 12 75    Block Design 13 84    Working Memory  88 21 81-97 Low Average  Digit Span 7 16    Letter-Number Seq. 9 37    Processing Speed  100 50 91-109 Average  Coding 10 50    Symbol Search 10 50    Full Scale IQ  101 53 96-106 Average   Woodcock-Johnson Tests of Achievement-Third Edition (WJ-III, Summer 2011) Cluster Standard Score Percentile Rank  Broad Math 99 47  Broad Written Language 98 44  Reading Comprehension 104 61  Math Calculation Skills 94 34  Basic Writing Skills  115 83  Written Expression 89 22  Academic Skills 106 65  Academic Applications 99 47   Child Behavior Checklist (CBCL, Summer 2011) Subtest Mother Father 3rd Grade Teacher  Attention Problems >97% 96% 89%  Thought Problems >97% >97% 84%  Social Problems >97% >97% 81%  Somatic Complaints 87% 62% <50%  Withdrawn/Depressed >97% 65% <50%  Anxious/Depressed >97% >97% >97%  Rule-Breaking  96% 96% <50%  Aggressive Behavior >97% >97% 73%  Inattentiveness   88%  Hyperactivity/Impulsivity   96%   The parent and teacher ADHD-RS and CAP rating scale are also reported to have been administered in the evaluation report. 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   Present Evaluation   Adult Self-Report for Ages 51-59 (ASR) To screen for additional symptomatology and provide an overview of Illya's perception of his current emotional and behavioral functioning, he completed the Adult Self-Report (ASR). The ASR asks raters to respond to questions across a range of behavioral presentations. Scores are then computed into domains (Depressive Problems, Anxiety Problems, Somatic Problems, Avoidant Personality Problems, AD/H Problems, and Antisocial Personality Problems) and compared to a sample of same-aged peers to produce percentile ranges and determine whether scores are in the normal, borderline, or clinically significant range of functioning. Jamarea endorsed scores that fell within normal limits across all domains of the ASR, indicating minimal perception of emotional or behavioral challenges.   Adult Behavior Checklist for Ages 84-59 (ABCL) To provide a general overview of Johnthan's behavior and screen for additional symptomatology, his mother and brother completed the Adult Behavior Checklist for Ages 51-59 (ABCL). The ABCL asks raters to respond to questions across a range of behavioral presentations. Scores are then computed into subscales, and compared to a sample of same-aged peers to produce percentile ranges and determine whether scores are in the normal, borderline, or clinically significant range of functioning. Sim's mother endorsed scores in the clinically significant range on the Depressive Problems and AD/H Problems subscales, and in the at-risk range on the Avoidant Personality Problems and Antisocial Personality Problems scales. Darin's brother endorsed scores in the clinically significant range on the Anxiety Problems scale, and in the at-risk range on the Depressive Problems, Avoidant Personality Problems, and Antisocial Personality Problems scales. On the Depressive Problems scale, Domenik's mother and brother reported that he "often" doesn't eat well, behaves in a passive  manner, and feels he can't succeed. On the Anxiety Problems scale, Yang's mother and brother reported that he "often" worries about the future, worries about his family, and worries in general. In terms of Avoidant Personality Problems, Kimmie's mother and brother reported that he "often" lacks self-confidence and has trouble keeping friends. On the AD/H Problems scale, Ronnel's mother and brother reported that he "often" fails to finish tasks, demonstrates poor work performance, is disorganized, loses things, and is impulsive. Lastly, on the Antisocial Personality Problems scale, Ferdinando's mother and brother reported that he "often" argues a lot, fails to pay debts, and has trouble keeping a job. Overall, inter-rater report is suggestive of difficulties in social situations, a tendency toward worry, and executive functioning challenges.   Social Responsiveness Scale, 2nd Edition (SRS-2): To specifically screen for ASD, Zakk, his mother, and his brother completed the Social Responsiveness Scale, 2nd Edition (SRS-2). The SRS-2 is a screening tool  used to help identify social impairments commonly associated with ASD, as well as gage their severity, as compared to typically developing peers. The SRS-2 provides T-scores across 5 domains of social functioning, as well as an overall score that can be indicative of the degree to which reported social functioning appears consistent with a diagnosis of ASD. Scores have a mean of 50 and a standard deviation of 10. Overall T-scores greater than 76 are considered to be strongly indicative of ASD, whereas scores in the range of 60-75 are considered to be in the mild to moderate range, and scores below 59 are considered to be in the normal range. Oriel's scores on the SRS-2 are provided in the table below.  Social Responsiveness Scale, 2nd Edition (SRS-2) Domain T-Score   Self Parent Other  Overall 56 84 14  Social Awareness 55 78 58  Social Cognition 65 28 79  Social  Communication 52 10 70  Social Motivation 49 77 69  Autistic Mannerisms 58 72 53   Inter-rater report varied significantly on the SRS-2. While [name] endorsed a score that fell within normal limits overall, his mother endorsed a score that fell in the severe range, and his brother endorsed a score in the moderate range. Mcdonald endorsed scores that fell within normal limits across all but the Social Cognition scale, where he endorsed a score that fell in the mild-moderate range. In contrast, his mother endorsed scores in the severe range across all but the Autistic Mannerisms subscale, where she endorsed a score in the moderate range. Lastly, his mother endorsed a score in the severe range on the Social Cognition subscale, scores in the moderate range on the Social Communication and Social Motivation subscales, and scores that fell within normal limits on the Social Awareness and Autistic Mannerism subscales. Overall, inter-rater report is consistently indicative of difficulties with social cognition, and suggests that Melique is observed by others to demonstrate clinically significant characteristics of autism spectrum disorder.   Social Communication Questionnaire (SCQ) To screen for the presence of social difficulty when Santana was between the ages of 4-5, as well as restricted and repetitive behaviors over the course of his lifetime, a friend of the family who has known Chaderick since very early childhood, Minnette Amato, completed the Lifetime form of the Social Communication Questionnaire. The SCQ is a 40-item measure designed to screen for symptoms of ASD in order to determine whether further evaluation is warranted. Scores above 15 are considered to be highly indicative of ASD and warrant further evaluation. Sandee Crook endorsed a total score of 21 on the SCQ, placing Aziz well above the clinically significant cut-off. In terms of difficulty with reciprocal social functioning between the ages of 74-5, Nicandro's  family friend reported that Emir did not spontaneously copy others, point for the purpose of sharing interest or requesting, use gestures to communicate, share items with others, share enjoyment of activities, try to comfort others who were sad or hurt, demonstrate a range of facial expressions, join into social games, engage in pretend play, appear interested in same-aged peers, respond positively when other children approached him, or engage in imaginative or cooperative play with other children. With regard to restricted and repetitive behavior, Gregroy's family friend reported that Maxx has demonstrated pronoun confusion, engaged in behavioral rituals, used others' hands as tools, demonstrated unusual preoccupations, engaged in repetitive use of toys and other objects, demonstrated unusually intense interests, and demonstrated hand and body mannerisms. Overall, SCQ scores suggest that [name] demonstrated clinically significant impairment  to his social functioning between the ages of 4-5, and has demonstrated the presence of restricted and repetitive behavior over the course of his lifetime.   Evaluation Summary Behavioral Observations Daevion was seen in person for cognitive and behavioral testing in early April of 2025. He was dressed and groomed appropriately for the situation and weather, and readily joined the examiner in the assessment room. Montey demonstrated a positive, pleasant, and motivated demeanor throughout testing. He reported that he had slept well the night before and had followed his typical morning routine. He frequently commented on his enjoyment of the evaluation process, including at one point during cognitive testing showing the examiner a book of challenging crossword puzzles he enjoys using to demonstrate his enjoyment of intellectual challenges. He demonstrated high frustration tolerance, taking longer to work as items became more challenging, but also making comments such as, "this is  hard, but I'm good with that!" and "I do enjoy this." He appeared to thoroughly consider his responses, including frequently pausing to return to previous items and change his answers. Although he frequently used the entire allotted time as items became challenging, he also readily moved on when prompted to by the examiner on several occasions. On subtests that allowed the use of scratch paper, he was observed to use the scratch paper consistently, and at one point asked the examiner if the scratch paper would be scored along with his responses. At times, he expressed curiosity about the evaluation process itself, including at one point commenting, "I know why I'm doing this, to see if I can respond consistently." On Verbal Working Memory and [employer], Naksh often closed his eyes while reasoning and providing responses. As testing progressed, Morrell commented on experiencing testing fatigue, ("I'm definitely starting to feel tired, but I really enjoy this"), and took a brief stretching break between nonverbal and verbal subtests before reporting that he felt ready to resume. Larnce took a 15-minute break in the waiting room before returning to complete the ADOS-2, Module 4. He readily returned to the assessment room and again demonstrated a motivated, inquisitive, and pleasant demeanor. During a Holiday representative task, Marke made a subtle joke by asking the examiner for more pieces after he had all of them, and then smiling while making eye contact to indicate that he was kidding. As during cognitive testing, he demonstrated a thoughtful and thorough working style, often providing somewhat lengthy responses about his own experiences, to a degree that the examiner, at times, interjected in order to move on to the next item. Overall, Eulice was able to complete all presented items without requiring modifications to test protocol, and reported that he felt he had performed in a manner accurately  representative of himself. Taken together, behavioral observations indicate that results can be interpreted as an accurate reflection of Jamus's current level of functioning.  Stanford-Binet Intelligence Scales, 5th Edition (SB5) To provide an overview of Mykah's current level of cognitive functioning, he completed the Stanford-Binet Intelligence Scales, 5th Edition (SB5). The SB5 assesses performance on tasks related to Fluid Reasoning, Knowledge, Quantitative Reasoning, Visual-Spatial Reasoning, and Working Memory across [employer] and Verbal domains of functioning. The SB-5 also provides a Full Scale IQ (FSIQ) which is calculated using scores on all other core subtests to provide a summary score of cognitive functioning. However, the FSIQ is not considered to be an accurate representation of functioning when the examinee demonstrates a significant discrepancy between domains and/or subtests. Scores are provided as standard scores, which have a mean  of 100 and standard deviation of 15. Ogle's scores on the SB-5 are provided in the table below.                SB-5 Domain Standard Score Scaled Score Percentile 95% Confidence Interval  Nonverbal IQ 96 39 90-102  Verbal IQ 95 37 89-101  Full Scale IQ 95 37 91-99  Fluid Reasoning  94 34 87-103  Nonverbal 9    Verbal  9    Knowledge  94 34 86-102  Nonverbal 9    Verbal 9    Quantitative Reasoning  100 50 92-108  Nonverbal 9    Verbal  11    Visual-Spatial Reasoning  88 21 81-97  Nonverbal  9    Verbal  7    Working Memory  103 58 95-111  Nonverbal  11    Verbal 10     Trindon's performance on the SB-5 fell solidly and consistently in the average range overall, and comparably across verbal and nonverbal domains (Nonverbal IQ=96, Verbal IQ=95). However, his score on the Visual-Spatial Reasoning domain (Visual-Spatial Reasoning=88) fell moderately below his scores on the Quantitative Reasoning and Working Memory domains, respectively  (Quantitative Reasoning=100, Working Memory=103), indicating that the Full Scale IQ (FSIQ) is likely not an accurate reflection of his overall level of functioning, and scores are best considered at the domain level. On the Fluid Reasoning and Knowledge domains, Atsushi scored solidly in the average range and comparably across verbal and nonverbal subtests. Susumu scored in the middle of the average range on the Quantitative Reasoning domain, and comparably across verbal and nonverbal subtests. Perri's weakest performance occurred on the Visual Spatial Reasoning domain, where he performed in the upper reaches of the below average range. He performed slightly more strongly on the Nonverbal than Verbal subtest, although the difference between scores was not statistically significant. Lastly, Rojelio's strongest performance occurred on the Working Memory domain, where he performed solidly in the average range and comparably across verbal and nonverbal subtests. Overall, Kalim's performance is indicative of solidly and evenly developed cognitive functioning, with an area of relative weakness in terms of his verbal working [employer] copy.   Adaptive Behavior Assessment System, 3rd Edition (ABAS-3):  To provide a measure of Angelito's current level of adaptive functioning, his mother completed the Adaptive Behavior Assessment System, 3rd Edition (ABAS-3). The ABAS-3 provides a measure of adaptive functioning across Conceptual, Social, and Practical domains, as well as a Programmer, systems Composite (GAC) score as a summary measure of overall adaptive functioning. Domain scores are provided as standard scores, which have a mean of 100 and standard deviation of 15. Subdomain scores are provided as scaled scores, which have a mean of 10 and a standard deviation of 3. Tylyn's scores on the ABAS-3 are provided in the table below.  ABAS-3, Parent Report  Standard Score Percentile Rank Confidence Interval   Scaled Score     Conceptual 85 16 79-91  Communication 9    Functional Academics 7    Self-Direction 7    Social 100 50 95-105  Leisure 9    Social 12    Practical 89 23 84-94  Community Use 8    Home Living 7    Health and Safety 10    Self-Care 9    GAC 90 25 87-93   Tyreik's self-reported scores on the ABAS-3 were roughly consistent with his performance on the SB-5, indicating that he currently performs day-to-day tasks at a level commensurate with his estimated  ability. His score on the Social domain fell significantly higher than his scores on the Conceptual or Practical domains, indicating that the [facility] cannot be considered an accurate reflection of his overall performance, and scores are best interpreted at the domain level. On the Conceptual domain, Delford reported that he talks about realistic future educational or career goals, reads important documents, and calls family or others when he will be late. However, he does not yet control his anger when others break the rules in games and fun activities, use lists and reminders to remember important things, or say irregular pronouns correctly. Taeden endorsed his strongest functioning on the Social domain, where he reported a score in the middle of the average range. Yakov reported that he participates in organized programs for sports or hobbies and sends thank-you notes after receiving gifts or help. Lastly, on the Practical domain, Vann endorsed a score in the upper reaches of the below average range. He reported that he uses [employer] or internet to [employer], follows a maintenance schedule for his car or home, makes his own appointments to see a physician for annual checkups, and washes and rinses the sink after brushing his teeth. However, he does not yet consistently cut or file his own nails, consistently buy over-the-counter medication for illness, keep working on important tasks even when it is noise, or consistently give careful thought to  the need for and cost of items before buying them.    Autism Diagnostic Observation Schedule, 2nd Edition (ADOS-2), Module 4 To screen specifically for characteristics of autism spectrum disorder (ASD), the Autism Diagnostic Observation Schedule, 2nd Edition (ADOS-2) was administered to assess Morey's social and behavioral functioning. The ADOS-2 is a semi-structured interaction designed to allow the examiner to observe for behaviors that are consistent with an ASD diagnosis across two domains: Social Affect (which includes nonverbal and reciprocal social functioning) and Restricted and Repetitive Behavior (which includes sensory interests, stereotyped language and motor movements, as well as excessive interests and repetitive behaviors). The ADOS-2 consists of four modules of activities, to be selected based on the individuals' language ability and developmental level. Mckennon completed Module 4 of the ADOS-2. On Module 4, which requires fluent speech and is designed for older adolescents/adults, the original algorithm includes a threshold for the Communication and Social Interaction domains, as well as for the combined Communication and Social Interaction sections that must be met for a classification of autism spectrum disorder. However, according to an article published in 2014, an updated algorithm was created for the Module 4 of the ADOS. Specifically, per the article: "Clinically, the Module 4 revisions yield scores that provide a more accurate summary of original algorithm. It also affords good sensitivity and improved specificity compared to the original Module 4 algorithm. Although it is always recommended that the ADOS be used as one source of information in a diagnostic battery, good specificity is particularly important in the assessment of adults, for whom parents are not always available to provide the comprehensive developmental history that is often helpful in making differential diagnoses."  (Hus, V. & Lord, C. (2014). The Autism Diagnostic Observations Schedule, Module 4: Revised Algorithm and Standardized Severity Scores. Journal of Autism and Developmental Disorders; 44(8), [id number].). As such, in addition to be being scored on the original algorithm, scores on the revised algorithm were also calculated. Corby scored at the cut-off for a designation of autism on the original scoring algorithm of the ADOS-2, and above the cut-off for a designation of autism on  the revised scoring algorithm.  Social Affect: Osama demonstrated an array of strengths in terms of his social affect during the ADOS-2 administration. He engaged in several 3-step conversations (e.g., examiner asks a question, Galan responds, examiner responds), asked the examiner about her thoughts and experiences on one occasion, and freely shared information about himself throughout the assessment. He also used gestures to demonstrate brushing his teeth when prompted and commented on the emotional state of a character in a book. Ladaris provided insightful descriptions of his emotional experiences, and engaged readily in social chat with the examiner. However, Sage also demonstrated several characteristics of ASD. His eye contact was inconsistent overall, alternating between minimal eye contact when provided with manipulatives, to periods of extended eye contact while the examiner was speaking. Although Davarion engaged in several 3-step conversations, he only engaged in one 4-step conversation (e.g., examiner comments, Enos responds, examiner comments, Elikai responds), and the majority of his participation in conversation consisted of sharing relevant information about himself. Similarly, although Jahmere demonstrated awareness of his own role in relationships by describing what he does that annoys others, he otherwise provided slightly one-sided descriptions (for example, he defined friendship as "a person who has your best interest at  heart"). Ailton rarely used descriptive gestures when not prompted, and although he provided a sequential verbal description of brushing his teeth, otherwise did not provide a sequential account of a non-routine event without prompting.   Restricted and Repetitive Behavior: Lenvil demonstrated several instances of fluid, creative, and imaginative thinking during the ADOS-2 administration. He likened a puzzle to an image from a video game, provided an imaginative narration of a story in a book, and readily used a set of random items to create a novel story. However, he also demonstrated a tendency toward somewhat formal speech, and tended to speak with a noticeably irregular rhythm.  Camouflaging Autistic Traits Questionnaire (CAT-Q) The Camouflaging Autistic Traits Questionnaire (CAT-Q) is a 25-item self-report measure of social camouflaging behaviors for individuals aged 40 and up. It is used to identify individuals who compensate for or mask autistic characteristics during social interactions and who might not immediately present with autistic traits due to their ability to mask. Scores above 100 on the CAT-Q are considered indicative of camouflaging traits Boykin Cable et al., 2019). Carr endorsed a total score of 96 on the CAT-Q, placing him slightly below the clinically significant cut-off overall. Kentrel's score on the Compensation and Masking subscales fell within a range commonly found among people on the spectrum. His scores on all other subscales of the CAT-Q fell within a range more commonly found among neurotypical individuals. The Compensation subscale refers to intentionally copying, learning, practicing, and repeating others' social behavior in order to camouflage characteristics of ASD. The Masking subscale refers to efforts to hide and minimize characteristics of ASD, such as by closely monitoring one's facial expression and eye contact, and thinking closely about the impression made on others. April's  response suggest that he may undertake some efforts camouflage by intentionally copying and practicing "appropriate" social behavior, as well through efforts to hide or minimize behaviors that may be perceived as "inappropriate" in order to manage the impression he makes on others.   Summary and Recommendations In order to meet criteria for ASD, individuals must demonstrate impaired functioning across two domains: Reciprocal Social Interaction/Social Affect and Restricted and Repetitive Behaviors. Additionally, characteristics must manifest across more than one setting, individuals must demonstrate a history of impairment across these two domains beginning in early childhood, and  this impairment must not be better explained by a different diagnosis.  During the developmental history, Sonnie and his mother described a tendency to follow his own train of thought in conversation resulting in difficulty establishing truly reciprocal exchanges, difficulty forming and maintaining friendships and relationships with others, and inconsistent use of nonverbal behavior such as eye contact and gestures for the purpose of reciprocal social communication, all of which have been present since Arthuro's early childhood. They also described the presence of several restricted and repetitive behaviors, including unusually intense interests, stereotyped speech, sensory aversions, repetitive use of toys and other objects, and possibly unusual preoccupations. This is consistent with clinically significant scores from Becky's mother and brother on the SRS-2, as well as a clinically significant score on the Lifetime form of the SCQ completed by a long-time family friend, indicating that social challenges have been present since Tavonte's early childhood. Inter-rater report is also consistent with observations from the present evaluation, during which Imari demonstrated some difficulty establishing truly reciprocal conversation despite a  pleasant and engaging demeanor, as well as inconsistent eye contact, a tendency toward formal speech, and a halting speech pattern, resulting in scores at or above the cut-off for a designation of "autism" across both scoring algorithms of the ADOS-2, Module 4. [name]'s qualitative report was highly consistent with his mother's during the developmental history, indicating the presence of characteristics of ASD from his early childhood. However, his own scores on the SRS-2 fell below the clinically significant cut-off overall, and across all but the Social Cognition subscale. Overall, Andreas's report fell below the clinically significant range across all questionnaires but the CAT-Q. This includes in response to questions about physical symptoms on the ASR, despite Whitman's current and past history of significant health challenges. Taken in consideration with qualitative descriptions of Jaylun as someone who "never complains" (written in the notes section of the SRS-2 by his mother), as well as with behavioral observations suggesting that Reynald demonstrates a motivated, pleasant demeanor, high frustration tolerance, and enjoyment of challenges, variations in scores between Chassell and other raters may be attributable to differences in reporting styles. Notably, despite this, Elden's responses on the Compensation and Masking domains of the CAT-Q fell within the clinically significant range, indicating that Lochie may undertake significant effort to camouflage characteristics of ASD and "pass" in social situations. Taken together, Cael meets criteria for a diagnosis of autism spectrum disorder, without intellectual impairment.   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  Dearion may benefit from participation in a social skills or meet-up group for neurodiverse individuals,  such as is available through the [employer] at [employer]), the [employer] at [employer] (http://johnson-rodgers.com/) or through [employer] such as https://howell-gardner.net/. Sricharan may benefit from participation in the [facility] Dialectical Behavior Therapy (DBT) group to develop strategies to regulate emotions (PreviewDomains.se) Should Avraham struggle to obtain and maintain employment, he may benefit from seeking job coaching, such as is available through [employer] [phone] or the [employer] [phone].  Emon may wish to explore whether he is eligible for additional health insurance coverage through the Innovations Waiver (https://www.autismsociety-[place].org/wp-content/uploads/The-Innovations-Waiver-1.pdf). Verden may benefit from continued participation in social activities based on specific areas of interest, including attendance at events and conferences.  Solon may wish to pursue support with social situations, executive functioning, and managing situational stressors through participation in individual cognitive behavioral therapy, such as  is available through [employer] [phone] or [facility] [phone].  It was a pleasure to work with you. Should you have any questions or require further assistance, please do not hesitate to contact me.   Resources The Autism Society of [place]  offers a range of resources to individuals with ASD and their families, including the opportunity to speak with a specialist to help coordinate care for newly diagnosed individuals. Their website can be found at: https://www.autismsociety-[place].org/# . To be placed in contact with a specialist, go to: https://www.autismsociety-[place].org/talk-with-a-specialist/  [facility]: The [facility] operates out of 7 regional  centers across [place]  offering intervention services for children and adults with ASD, as well as trainings for caregivers and educators working with individuals with ASD. The contact information for the [facility] is: [phone], and their website can be found at: PreviewDomains.se.  [facility]: The [facility], located in [place], [place] , offers a range of research and intervention opportunities for individuals with ASD. Their website can be found at: https://autismcenter.GuamGaming.ch. For clinical services, call: [phone]. To learn more about ongoing research projects, contact: [phone] Treyvon's caregivers and teachers can access important, free information about ASD, including red flags, treatment options, and additional resources through the Autism Navigator website (https://autismnavigator.com/). The Organization for Autism Research (OAR) offers an array of resources for individuals with ASD, as well as their teachers and siblings, on their website: https://researchautism.org/resources/.  The [employer] ([employer]) on ASD offers several free online modules designed to help guide the use of empirically based interventions for individuals with ASD: https://afirm.PureLoser.pl Autism Unbound: Autism [employer]bound is a Transport planner aimed at addressing the needs of the autism community. [employer] offers a range of activities and workshops for individuals with ASD and their families, including siblings. Their website is: https://autismunbound.org/ [employer]: The [employer] is a local organization geared toward providing resources and support for individuals with ASD and their families. They offer several social groups for individuals with ASD from childhood into adulthood, including both casual opportunities to  socialize as well as social skills training groups. You can contact their office by phone at: [phone]. Their website is: UpholsteryDesigners.gl [facility]: [facility] offers educational and behavioral health services for individuals with developmental differences. Their contact information is: [phone]. Autism Speaks is a Tax inspector to research and service for individuals with ASD and their families. They offer a range of resources through their website, including a variety of free tool kits that can be printed out or used electronically. Among these tool kits is a "100 Day Kit," which breaks down important steps to take within the first 100 days of an ASD diagnosis. [employer]aks tool kits can be found at: https://www.autismspeaks.org/tool-kit/100-day-kit-young-children [employer] of Social Work [employer] several helpful resources for individuals diagnosed with ASD and their families. Their services include a resource specialist who can help connect families with helpful resources, as well as opportunities to connect with other families affected by an ASD diagnosis. The website can be found at: http://www.smith-williams.com/ The Family Support Network of   offers services for families of children with special needs. Their website can be found at: http://www.lang.org/. The [facility]) offers a range of services for individuals diagnosed with developmental disabilities and their families, including early intervention services for children under the age of 3 and trainings for caregivers and teachers. Their  website can be found at: https://www.ecac-parentcenter.org/training-and-events-calendar/ Individuals with ASD may be eligible for Medicaid services to help cover the cost of interventions. You may wish to contact the Local Management Entity-Managed Care  Organization [facility]) for [facility], the [facility] at: [phone] to see what services Drequan might qualify for.   Reading List About Autism Autism Spectrum Disorders: The Complete Guide to Understanding Autism, Asperger's Syndrome, Pervasive Developmental Disorder, and Other ASDs by Anders Katz  The Hidden Curriculum: Practical Solutions for Understanding Unstated Rules in Social Situations by Perrin Brakeman, Julio Ohm, & Tonja Fray               Arlene Lacy, PhD

## 2023-08-15 DIAGNOSIS — F4321 Adjustment disorder with depressed mood: Secondary | ICD-10-CM | POA: Diagnosis not present

## 2023-08-15 DIAGNOSIS — R636 Underweight: Secondary | ICD-10-CM | POA: Diagnosis not present

## 2023-08-15 DIAGNOSIS — Z713 Dietary counseling and surveillance: Secondary | ICD-10-CM | POA: Diagnosis not present

## 2023-08-15 DIAGNOSIS — D509 Iron deficiency anemia, unspecified: Secondary | ICD-10-CM | POA: Diagnosis not present

## 2023-08-15 DIAGNOSIS — K50919 Crohn's disease, unspecified, with unspecified complications: Secondary | ICD-10-CM | POA: Diagnosis not present

## 2023-08-22 DIAGNOSIS — F4321 Adjustment disorder with depressed mood: Secondary | ICD-10-CM | POA: Diagnosis not present

## 2023-08-29 DIAGNOSIS — F4321 Adjustment disorder with depressed mood: Secondary | ICD-10-CM | POA: Diagnosis not present

## 2023-09-19 ENCOUNTER — Encounter: Payer: Self-pay | Admitting: Gastroenterology

## 2023-09-20 ENCOUNTER — Other Ambulatory Visit (HOSPITAL_COMMUNITY): Payer: Self-pay

## 2023-09-20 MED ORDER — LISDEXAMFETAMINE DIMESYLATE 50 MG PO CAPS
50.0000 mg | ORAL_CAPSULE | Freq: Every day | ORAL | 0 refills | Status: DC
  Filled 2023-09-20 – 2023-09-21 (×2): qty 30, 30d supply, fill #0

## 2023-09-21 ENCOUNTER — Encounter: Payer: Self-pay | Admitting: Gastroenterology

## 2023-09-21 ENCOUNTER — Other Ambulatory Visit (HOSPITAL_COMMUNITY): Payer: Self-pay

## 2023-09-26 ENCOUNTER — Other Ambulatory Visit (HOSPITAL_COMMUNITY): Payer: Self-pay

## 2023-09-26 ENCOUNTER — Encounter: Payer: Self-pay | Admitting: Gastroenterology

## 2023-09-26 ENCOUNTER — Ambulatory Visit

## 2023-09-26 VITALS — BP 124/73 | HR 103 | Temp 98.3°F | Resp 16 | Ht 63.5 in | Wt 103.6 lb

## 2023-09-26 DIAGNOSIS — K50113 Crohn's disease of large intestine with fistula: Secondary | ICD-10-CM

## 2023-09-26 MED ORDER — VEDOLIZUMAB 300 MG IV SOLR
300.0000 mg | Freq: Once | INTRAVENOUS | Status: AC
  Administered 2023-09-26: 300 mg via INTRAVENOUS
  Filled 2023-09-26: qty 5

## 2023-09-26 MED ORDER — LISDEXAMFETAMINE DIMESYLATE 50 MG PO CAPS
50.0000 mg | ORAL_CAPSULE | Freq: Every day | ORAL | 0 refills | Status: DC
  Filled 2023-09-26 – 2023-10-24 (×2): qty 30, 30d supply, fill #0

## 2023-09-26 NOTE — Progress Notes (Signed)
 Diagnosis: Crohn's Disease  Provider:  Praveen Mannam MD  Procedure: IV Infusion  IV Type: Peripheral, IV Location: L Antecubital  Entyvio  (Vedolizumab ), Dose: 300 mg  Infusion Start Time: 1338  Infusion Stop Time: 1415  Post Infusion IV Care: Peripheral IV Discontinued  Discharge: Condition: Good, Destination: Home . AVS Declined  Performed by:  Maximiano JONELLE Pouch, LPN

## 2023-10-24 ENCOUNTER — Encounter: Payer: Self-pay | Admitting: Gastroenterology

## 2023-10-24 ENCOUNTER — Other Ambulatory Visit (HOSPITAL_COMMUNITY): Payer: Self-pay

## 2023-10-25 ENCOUNTER — Encounter: Payer: Self-pay | Admitting: Gastroenterology

## 2023-10-25 ENCOUNTER — Other Ambulatory Visit (HOSPITAL_COMMUNITY): Payer: Self-pay

## 2023-10-25 MED ORDER — LISDEXAMFETAMINE DIMESYLATE 50 MG PO CAPS
50.0000 mg | ORAL_CAPSULE | Freq: Every day | ORAL | 0 refills | Status: AC
  Filled 2023-10-25: qty 30, 30d supply, fill #0

## 2023-11-07 ENCOUNTER — Ambulatory Visit

## 2023-11-07 VITALS — BP 105/66 | HR 108 | Temp 98.0°F | Resp 16 | Ht 63.5 in | Wt 105.4 lb

## 2023-11-07 DIAGNOSIS — K50113 Crohn's disease of large intestine with fistula: Secondary | ICD-10-CM

## 2023-11-07 MED ORDER — VEDOLIZUMAB 300 MG IV SOLR
300.0000 mg | Freq: Once | INTRAVENOUS | Status: AC
  Administered 2023-11-07: 300 mg via INTRAVENOUS
  Filled 2023-11-07: qty 5

## 2023-11-07 NOTE — Progress Notes (Signed)
 Diagnosis: Crohn's Disease  Provider:  Praveen Mannam MD  Procedure: IV Infusion  IV Type: Peripheral, IV Location: L Antecubital  Entyvio  (Vedolizumab ), Dose: 300 mg  Infusion Start Time: 1322  Infusion Stop Time: 1357  Post Infusion IV Care: Peripheral IV Discontinued  Discharge: Condition: Good, Destination: Home . AVS Provided  Performed by:  Maximiano JONELLE Pouch, LPN

## 2023-11-22 ENCOUNTER — Encounter: Payer: Self-pay | Admitting: Gastroenterology

## 2023-11-26 ENCOUNTER — Encounter: Payer: Self-pay | Admitting: Gastroenterology

## 2023-12-19 ENCOUNTER — Ambulatory Visit

## 2023-12-19 VITALS — BP 101/65 | HR 103 | Temp 98.1°F | Resp 18 | Ht 63.5 in | Wt 105.6 lb

## 2023-12-19 DIAGNOSIS — K50113 Crohn's disease of large intestine with fistula: Secondary | ICD-10-CM | POA: Diagnosis not present

## 2023-12-19 MED ORDER — VEDOLIZUMAB 300 MG IV SOLR
300.0000 mg | Freq: Once | INTRAVENOUS | Status: AC
  Administered 2023-12-19: 300 mg via INTRAVENOUS
  Filled 2023-12-19: qty 5

## 2023-12-19 NOTE — Progress Notes (Signed)
 Diagnosis: Crohn's Disease  Provider:  Praveen Mannam MD  Procedure: IV Infusion  IV Type: Peripheral, IV Location: L Antecubital  Entyvio  (Vedolizumab ), Dose: 300 mg  Infusion Start Time: 1521  Infusion Stop Time: 1555  Post Infusion IV Care: Peripheral IV Discontinued  Discharge: Condition: Good, Destination: Home . AVS Declined  Performed by:  Maximiano JONELLE Pouch, LPN

## 2024-01-01 ENCOUNTER — Encounter (INDEPENDENT_AMBULATORY_CARE_PROVIDER_SITE_OTHER): Payer: Self-pay

## 2024-01-02 ENCOUNTER — Encounter (INDEPENDENT_AMBULATORY_CARE_PROVIDER_SITE_OTHER): Payer: Self-pay

## 2024-01-30 ENCOUNTER — Ambulatory Visit

## 2024-01-30 VITALS — BP 109/69 | HR 118 | Temp 98.1°F | Resp 18 | Ht 63.5 in | Wt 109.8 lb

## 2024-01-30 DIAGNOSIS — K50113 Crohn's disease of large intestine with fistula: Secondary | ICD-10-CM | POA: Diagnosis not present

## 2024-01-30 MED ORDER — VEDOLIZUMAB 300 MG IV SOLR
300.0000 mg | Freq: Once | INTRAVENOUS | Status: AC
  Administered 2024-01-30: 300 mg via INTRAVENOUS
  Filled 2024-01-30: qty 5

## 2024-01-30 NOTE — Progress Notes (Signed)
 Diagnosis: Crohn's Disease  Provider:  Praveen Mannam MD  Procedure: IV Infusion  IV Type: Peripheral, IV Location: R Forearm  Entyvio  (Vedolizumab ), Dose: 300 mg  Infusion Start Time: 1508  Infusion Stop Time: 1543  Post Infusion IV Care: Peripheral IV Discontinued  Discharge: Condition: Good, Destination: Home . AVS Declined  Performed by:  Kade Rickels, RN

## 2024-02-16 NOTE — Progress Notes (Unsigned)
 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   Previously followed by peds GI, Dr. Leatrice.  Carries a diagnosis of colitis since 2017.  On Humira  in the past but it did not provide any benefit, sounds like he was a primary nonresponder.  He had been on courses of steroids over the years.  Has a history of C. difficile.  Was on Xeljanz  for what sounds like a year or so in 2023, but it did not control him and he had flare despite that. 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 Teresa of colorectal surgery  He has been on Entyvio  since December 2023 with interval healing   SINCE LAST VISIT   Patient here for follow-up visit.  Since his last visit we did an upper endoscopy back in May, he is chronically underweight, wanted to rule out upper tract Crohn's.  The exam looked good, no evidence of upper tract disease.  He has been seen by nutritionist since her last visit.  He states his appetite has been better, eating more nourishing foods and he has gained about 9 pounds over the past 6 months, he is very happy about that.  In addition we did a fecal calprotectin in April, he had a normal value.  He denies any new changes with his bowels.  He does have some chronic constipation/passing hard stool and when he does this this can cause rectal pain.  He has very rare bleeding.  He has had a history of fissure and fistula in the past.  He denies any drainage or purulence in his perirectal area.  Pain is only when he is passing the stool.  He has used Calmol 4 suppositories which can help.  He previously was on Linzess  and MiraLAX , no longer taking.  We had discussed using fiber supplementation to keep his stools soft but he states he does not use it regularly but when he does use  them it can help.  His preference is to use [employer] fiber Gummies.   He has not had any perianal abscess or drainage in regards to fistula.  Recall he seen colorectal surgery in the past and given he did not have any obvious fistulous opening or active disease they did not recommend any intervention.  He has been working and functioning okay.  He seems happy with the regimen, he inquired about options and if he can stop Entyvio .  He denies any problems with the infusion and tolerates it well.  Last set of labs was 6 months ago.  He does have a history of vitamin D  deficiency and has been taking his vitamin D .     Prior workup: Colonoscopy 09/30/20 - active left sided colitis on biopsies- moderate   Colonoscopy 01/18/2022 - Dr. Leatrice - active inflammation from anal margin to 20cm from anal verge - May score 1, improved from prior exam reportedly. Normal ileum, cecal patch noted. Poor prep noted.   Path shows normal right / transverse colon, mildly active left sided disease     MRI pelvis 03/14/22: IMPRESSION: Simple intersphincteric perianal fistula originating at the 6 o'clock position of the inferior anal sphincter, with extension in the subcutaneous tissues along the left side of the gluteal crease. No evidence of abscess.   Fecal  calprotectin 3660 - 03/16/22   1st Enyvio  infusion 03/21/22   Fecal calprotectin 07/13/22 - 228   Increased Entyvio  to every 6 week dosing   Fecal calprotectin 11/26/22 - 240     Colonoscopy 12/31/22: - Hemorrhoids were found on perianal exam. Large / inflamed and prolapse but able to be reduced (grade III). No obvious fistulas noted. Some superficial ulceration overlying hemorrhoids noted. Findings: - The terminal ileum appeared normal. - A large amount of liquid stool was found in the entire colon, making visualization difficult. Lavage of the colon was performed using copious amounts, resulting in clearance with mostly adequate visualization. Some  areas of the dependant portion of the colon were hard to clear entirely and some obscured views in those areas. Several minutes spent lavaging the colon to achieve adequate views. - Internal hemorrhoids were found during retroflexion. Scarring of the distal rectum noted on retroflexed views but no obvious active inflammation. - The exam was otherwise without abnormality. No active inflammation.   FINAL DIAGNOSIS       1. Surgical [P], right colon, transverse :      -  COLONIC MUCOSA WITH A PROMINENT LYMPHOID AGGREGATE, OTHERWISE NO SIGNIFICANT      PATHOLOGY.       2. Surgical [P], left colon :      -  COLONIC MUCOSA WITH MINIMAL ARCHITECTURAL DISARRAY AND A PROMINENT LYMPHOID      AGGREGATE.      NOTE: THE CLINICAL HISTORY OF CROHN'S DISEASE IS NOTED.  PART 2 SHOWS MINIMAL      ARCHITECTURAL DISARRAY AND PART 1 DOES SHOW OCCASIONAL CRYPT DROPOUT; HOWEVER,      THERE IS OTHERWISE NO SIGNIFICANT PATHOLOGIC ABNORMALITY INCLUDING ACTIVITY,      GRANULOMAS, VIRAL CYTOPATHIC EFFECT OR DYSPLASIA.  THE OVERALL FINDINGS COULD      REPRESENT A CHRONIC INACTIVE/QUIESCENT COLITIS.  DEFINITIVE EVIDENCE OF CROHN'S      DISEASE IS NOT IDENTIFIED.     Fecal calprotectin 07/29/23 - 16  EGD 08/01/23: - Esophagogastric landmarks were identified: the Z-line was found at 39 cm, the gastroesophageal junction was found at 39 cm and the upper extent of the gastric folds was found at 39 cm from the incisors. Findings: - The exam of the esophagus was otherwise normal. - The entire examined stomach was normal. Biopsies were taken with a cold forceps for Helicobacter pylori testing. - The examined duodenum was normal. Biopsies were taken with a cold forceps for histology.  FINAL DIAGNOSIS        1. Surgical [P], small bowel biopsies :       - SMALL INTESTINAL MUCOSA WITH NO SPECIFIC HISTOPATHOLOGIC CHANGES       - NEGATIVE FOR INCREASED INTRAEPITHELIAL LYMPHOCYTES OR VILLOUS ARCHITECTURAL       CHANGES        2.  Surgical [P], gastric antrum and gastric body :       - GASTRIC ANTRAL AND OXYNTIC MUCOSA WITH NO SPECIFIC HISTOPATHOLOGIC CHANGES       - HELICOBACTER PYLORI-LIKE ORGANISMS ARE NOT IDENTIFIED ON ROUTINE H&E STAIN    Past Medical History:  Diagnosis Date   ADHD (attention deficit hyperactivity disorder)    Anal fissure    Anemia    Anxiety    Chronic constipation    Crohn's disease (HCC)    Depression    Encopresis(307.7)    Growth hormone deficiency    GSE (gluten-sensitive enteropathy)    Hemorrhoids    Inflammatory bowel  disease    Lactose intolerance    Mood disorder    Perianal fistula    Psoriasis    Scoliosis    Short stature    Solitary rectal ulcer syndrome      Past Surgical History:  Procedure Laterality Date   ADENOIDECTOMY     COLONOSCOPY N/A 12/27/2015   Procedure: COLONOSCOPY;  Surgeon: Charlie Elbe, MD;  Location: [name] ENDOSCOPY;  Service: Gastroenterology;  Laterality: N/A;   ESOPHAGOGASTRODUODENOSCOPY N/A 12/27/2015   Procedure: ESOPHAGOGASTRODUODENOSCOPY (EGD);  Surgeon: Charlie Elbe, MD;  Location: [facility] ENDOSCOPY;  Service: Gastroenterology;  Laterality: N/A;   ESOPHAGOGASTRODUODENOSCOPY N/A 08/03/2016   Procedure: ESOPHAGOGASTRODUODENOSCOPY (EGD);  Surgeon: Elbe Charlie, MD;  Location: [facility] ENDOSCOPY;  Service: Gastroenterology;  Laterality: N/A;   FLEXIBLE SIGMOIDOSCOPY  08/03/2016   Procedure: FLEXIBLE SIGMOIDOSCOPY;  Surgeon: Elbe Charlie, MD;  Location: [facility] ENDOSCOPY;  Service: Gastroenterology;;   RECTOPEXY  01/19/2021   laproscopy, rectopexy for Prolapse, sigmoidoscopy, flex  ( Dr. Ozell Bernardino [place] UNK)   tubes and adenoids     Family History  Problem Relation Age of Onset   Depression Mother    Miscarriages / Stillbirths Mother    Hyperlipidemia Maternal Grandmother    Depression Maternal Grandmother    Cancer Maternal Grandfather        prostate   Hyperlipidemia Maternal Grandfather    Hypertension Maternal Grandfather    Ulcerative  colitis Maternal Grandfather    Hyperlipidemia Paternal Grandmother    Thyroid  disease Paternal Grandmother    Cancer Paternal Grandmother    Diabetes Paternal Grandfather    Hyperlipidemia Paternal Grandfather    Cancer Paternal Grandfather    Thyroid  disease Paternal Grandfather    Depression Maternal Aunt    Celiac disease Neg Hx    Hirschsprung's disease Neg Hx    Colon cancer Neg Hx    Esophageal cancer Neg Hx    Pancreatic cancer Neg Hx    Stomach cancer Neg Hx    Social History   Tobacco Use   Smoking status: Never   Smokeless tobacco: Never  Vaping Use   Vaping status: Never Used  Substance Use Topics   Alcohol use: No   Drug use: No   Current Outpatient Medications  Medication Sig Dispense Refill   AMBULATORY NON FORMULARY MEDICATION Diltiazem gel with 5% lidocaine   Apply a pea sized amount into your rectum three times daily as needed 30 g 0   Cholecalciferol (VITAMIN D -3) 125 MCG (5000 UT) TABS Take 5,000 Units by mouth daily. 30 tablet    ENTYVIO  300 MG injection RECONSTITUTE WITH 4.8 ML STERILE WATER. ADD 5 ML TO 250 ML NS AND INFUSE 300 MG INTRAVENOUSLY OVER 30 MINUTES EVERY 6 WEEKS 1 each 6   escitalopram  (LEXAPRO ) 20 MG tablet 2.5 tablets Orally Once a day for 30 days     ferrous sulfate 325 (65 FE) MG tablet Take 325 mg by mouth daily with breakfast.     hydrocortisone  (ANUSOL -HC) 25 MG suppository Place 1 suppository (25 mg total) rectally daily. 10 suppository 1   lamoTRIgine  (LAMICTAL ) 150 MG tablet Take 1 tablet (150 mg total) by mouth 2 (two) times daily. 60 tablet 1   lisdexamfetamine (VYVANSE ) 50 MG capsule Take 1 capsule (50 mg total) by mouth daily. 30 capsule 0   lithium  carbonate (LITHOBID) 300 MG ER tablet Take 300 mg by mouth at bedtime.     Rectal Protectant-Emollient (CALMOL-4) 76-10 % SUPP Use as directed as needed     tiZANidine (ZANAFLEX)  4 MG tablet Take 4 mg by mouth at bedtime as needed.     traZODone  (DESYREL ) 50 MG tablet Take 75 mg by  mouth at bedtime.     VYVANSE  70 MG capsule Take 70 mg by mouth every morning.     No current facility-administered medications for this visit.   Allergies  Allergen Reactions   Trileptal [Oxcarbazepine] Rash     Review of Systems: All systems reviewed and negative except where noted in HPI.   Lab Results  Component Value Date   WBC 10.8 (H) 07/22/2023   HGB 13.8 07/22/2023   HCT 41.9 07/22/2023   MCV 91.1 07/22/2023   PLT 375.0 07/22/2023    Lab Results  Component Value Date   NA 139 07/22/2023   CL 100 07/22/2023   K 4.3 07/22/2023   CO2 32 07/22/2023   BUN 16 07/22/2023   CREATININE 0.85 07/22/2023   GFR 122.37 07/22/2023   CALCIUM  9.9 07/22/2023   PHOS 5.1 (H) 08/09/2016   ALBUMIN 4.5 07/22/2023   GLUCOSE 92 07/22/2023    Lab Results  Component Value Date   ALT 41 07/22/2023   AST 26 07/22/2023   ALKPHOS 131 (H) 07/22/2023   BILITOT 0.3 07/22/2023     Physical Exam: BP 102/60   Pulse 88   Ht 5' 3.5 (1.613 m)   Wt 109 lb (49.4 kg)   BMI 19.01 kg/m  Constitutional: Pleasant,well-developed, male in no acute distress. Perianal exam - CMA Alan Fusi as standby - no fissure, no fistula Neurological: Alert and oriented to person place and time. Skin: Skin is warm and dry. No rashes noted. Psychiatric: Normal mood and affect. Behavior is normal.   ASSESSMENT: 23 y.o. male here for assessment of the following  1. Crohn's disease of large intestine with other complication (HCC)   2. Other constipation   3. Underweight    Generally doing well since I have last seen him.  I do not think he has active Crohn's disease based on last colonoscopy and fecal calprotectin, labs look okay.  His bowel symptoms are stable (has chronic hard stools).  He has done really well on Entyvio .  He asks if he can stop it altogether and monitor.  We discussed his Crohn's disease in general, Entyvio  has worked well and I think if he stops this therapy ultimately his Crohn's  will very likely recur.  He tolerates the infusions, we discussed as a very good safety profile and is a low risk medication.  I highly recommend he continue it and he understands this and agrees.  He has some rectal discomfort with passing hard stools.  I do not see any obvious active fistulas or fissure on exam today.  I think he is having symptoms from hemorrhoids or spasm.  Calmol 4 suppositories help.  I recommend he use a daily fiber supplement every day or every other day to keep stools soft, this has helped him in the past but he does not like taking it routinely.  He understands the issues and seems to be amenable to use this more frequently.  He is due for basic labs today, we will check those as well as a vitamin D .  Plan on continuing Entyvio  for now.  Follow-up in 6 months if not sooner with any issues.  He agrees.   PLAN: - labs - CBC, CMET, vitamin D  - continue Entyvio  - add fiber supplement daily or PRN - will use gummies - f/u 6 months or  sooner with issues  Marcey Naval, MD [facility]

## 2024-02-17 ENCOUNTER — Encounter: Payer: Self-pay | Admitting: Gastroenterology

## 2024-02-17 ENCOUNTER — Ambulatory Visit: Admitting: Gastroenterology

## 2024-02-17 VITALS — BP 102/60 | HR 88 | Ht 63.5 in | Wt 109.0 lb

## 2024-02-17 DIAGNOSIS — K50118 Crohn's disease of large intestine with other complication: Secondary | ICD-10-CM

## 2024-02-17 DIAGNOSIS — R636 Underweight: Secondary | ICD-10-CM | POA: Diagnosis not present

## 2024-02-17 DIAGNOSIS — K5909 Other constipation: Secondary | ICD-10-CM | POA: Diagnosis not present

## 2024-02-17 NOTE — Patient Instructions (Signed)
 Your provider has requested that you go to the basement level for lab work before leaving today. Press B on the elevator. The lab is located at the first door on the left as you exit the elevator.  Continue Entyvio .   Please purchase the following medications over the counter and take as directed: Fiber supplement to take daily as needed.   _______________________________________________________  If your blood pressure at your visit was 140/90 or greater, please contact your primary care physician to follow up on this.  _______________________________________________________  If you are age 24 or older, your body mass index should be between 23-30. Your Body mass index is 19.01 kg/m. If this is out of the aforementioned range listed, please consider follow up with your Primary Care Provider.  If you are age 24 or younger, your body mass index should be between 19-25. Your Body mass index is 19.01 kg/m. If this is out of the aformentioned range listed, please consider follow up with your Primary Care Provider.   ________________________________________________________  The [facility] providers would like to encourage you to use MYCHART to communicate with providers for non-urgent requests or questions.  Due to long hold times on the telephone, sending your provider a message by [facility] may be a faster and more efficient way to get a response.  Please allow 48 business hours for a response.  Please remember that this is for non-urgent requests.  _______________________________________________________  [facility] is using a team-based approach to care.  Your team is made up of your doctor and two to three APPS. Our APPS (Nurse Practitioners and Physician Assistants) work with your physician to ensure care continuity for you. They are fully qualified to address your health concerns and develop a treatment plan. They communicate directly with your gastroenterologist to care for you.  Seeing the Advanced Practice Practitioners on your physician's team can help you by facilitating care more promptly, often allowing for earlier appointments, access to diagnostic testing, procedures, and other specialty referrals.

## 2024-02-21 ENCOUNTER — Other Ambulatory Visit (INDEPENDENT_AMBULATORY_CARE_PROVIDER_SITE_OTHER)

## 2024-02-21 ENCOUNTER — Ambulatory Visit: Payer: Self-pay | Admitting: Gastroenterology

## 2024-02-21 DIAGNOSIS — K50118 Crohn's disease of large intestine with other complication: Secondary | ICD-10-CM

## 2024-02-21 LAB — CBC WITH DIFFERENTIAL/PLATELET
Basophils Absolute: 0.1 K/uL (ref 0.0–0.1)
Basophils Relative: 0.6 % (ref 0.0–3.0)
Eosinophils Absolute: 0.2 K/uL (ref 0.0–0.7)
Eosinophils Relative: 2.7 % (ref 0.0–5.0)
HCT: 39.7 % (ref 39.0–52.0)
Hemoglobin: 13.3 g/dL (ref 13.0–17.0)
Lymphocytes Relative: 27.1 % (ref 12.0–46.0)
Lymphs Abs: 2.4 K/uL (ref 0.7–4.0)
MCHC: 33.4 g/dL (ref 30.0–36.0)
MCV: 86.9 fl (ref 78.0–100.0)
Monocytes Absolute: 0.6 K/uL (ref 0.1–1.0)
Monocytes Relative: 7.4 % (ref 3.0–12.0)
Neutro Abs: 5.4 K/uL (ref 1.4–7.7)
Neutrophils Relative %: 62.2 % (ref 43.0–77.0)
Platelets: 332 K/uL (ref 150.0–400.0)
RBC: 4.56 Mil/uL (ref 4.22–5.81)
RDW: 13.6 % (ref 11.5–15.5)
WBC: 8.7 K/uL (ref 4.0–10.5)

## 2024-02-21 LAB — COMPREHENSIVE METABOLIC PANEL WITH GFR
ALT: 14 U/L (ref 0–53)
AST: 16 U/L (ref 0–37)
Albumin: 4.2 g/dL (ref 3.5–5.2)
Alkaline Phosphatase: 127 U/L — ABNORMAL HIGH (ref 39–117)
BUN: 11 mg/dL (ref 6–23)
CO2: 32 meq/L (ref 19–32)
Calcium: 9.8 mg/dL (ref 8.4–10.5)
Chloride: 102 meq/L (ref 96–112)
Creatinine, Ser: 0.84 mg/dL (ref 0.40–1.50)
GFR: 122.3 mL/min (ref >60.00)
Glucose, Bld: 109 mg/dL — ABNORMAL HIGH (ref 70–99)
Potassium: 4 meq/L (ref 3.5–5.1)
Sodium: 141 meq/L (ref 135–145)
Total Bilirubin: 0.3 mg/dL (ref 0.2–1.2)
Total Protein: 7.6 g/dL (ref 6.0–8.3)

## 2024-02-21 LAB — VITAMIN D 25 HYDROXY (VIT D DEFICIENCY, FRACTURES): VITD: 27.79 ng/mL — ABNORMAL LOW (ref 30.00–100.00)

## 2024-03-13 ENCOUNTER — Ambulatory Visit

## 2024-03-19 ENCOUNTER — Ambulatory Visit

## 2024-03-19 VITALS — BP 94/59 | HR 79 | Temp 97.8°F | Resp 16 | Ht 63.5 in | Wt 109.0 lb

## 2024-03-19 DIAGNOSIS — K50113 Crohn's disease of large intestine with fistula: Secondary | ICD-10-CM | POA: Diagnosis not present

## 2024-03-19 MED ORDER — VEDOLIZUMAB 300 MG IV SOLR
300.0000 mg | Freq: Once | INTRAVENOUS | Status: AC
  Administered 2024-03-19: 14:00:00 300 mg via INTRAVENOUS
  Filled 2024-03-19: qty 5

## 2024-03-19 NOTE — Progress Notes (Cosign Needed)
 Diagnosis: , Crohn's Disease  Provider:  Praveen Mannam MD  Procedure: IV Infusion  IV Type: Peripheral, IV Location: L Forearm  , Entyvio  (Vedolizumab ), Dose: 300 mg  Infusion Start Time: 1413  Infusion Stop Time: 1449  Post Infusion IV Care: Peripheral IV Discontinued  Discharge: Condition: Good, Destination: Home . AVS Declined  Performed by:  Donny Childes, RN

## 2024-04-30 ENCOUNTER — Ambulatory Visit

## 2024-04-30 VITALS — BP 110/69 | HR 106 | Temp 97.9°F | Resp 16 | Ht 63.5 in | Wt 110.6 lb

## 2024-04-30 DIAGNOSIS — K50113 Crohn's disease of large intestine with fistula: Secondary | ICD-10-CM

## 2024-04-30 MED ORDER — VEDOLIZUMAB 300 MG IV SOLR
300.0000 mg | Freq: Once | INTRAVENOUS | Status: AC
  Administered 2024-04-30: 300 mg via INTRAVENOUS
  Filled 2024-04-30: qty 5

## 2024-04-30 NOTE — Progress Notes (Signed)
 Diagnosis:  Crohn's Disease  Provider:  Praveen Mannam MD  Procedure: IV Infusion  IV Type: Peripheral, IV Location: L Antecubital   Entyvio  (Vedolizumab ), Dose: 300 mg  Infusion Start Time: 1228  Infusion Stop Time: 1300  Post Infusion IV Care: Peripheral IV Discontinued  Discharge: Condition: Good, Destination: Home . AVS Declined  Performed by:  Maximiano JONELLE Pouch, LPN

## 2024-06-11 ENCOUNTER — Ambulatory Visit
